# Patient Record
Sex: Male | Born: 1945 | Race: White | Hispanic: No | Marital: Single | Smoking: Never smoker
Health system: Southern US, Community
[De-identification: ages and names within clinical notes are randomized; demographics above are authoritative.]

## PROBLEM LIST (undated history)

## (undated) DIAGNOSIS — I10 Essential (primary) hypertension: Secondary | ICD-10-CM

## (undated) DIAGNOSIS — E785 Hyperlipidemia, unspecified: Secondary | ICD-10-CM

## (undated) DIAGNOSIS — A809 Acute poliomyelitis, unspecified: Secondary | ICD-10-CM

## (undated) DIAGNOSIS — N189 Chronic kidney disease, unspecified: Secondary | ICD-10-CM

## (undated) DIAGNOSIS — F99 Mental disorder, not otherwise specified: Secondary | ICD-10-CM

## (undated) DIAGNOSIS — N200 Calculus of kidney: Secondary | ICD-10-CM

## (undated) DIAGNOSIS — C4490 Unspecified malignant neoplasm of skin, unspecified: Secondary | ICD-10-CM

## (undated) DIAGNOSIS — M1990 Unspecified osteoarthritis, unspecified site: Secondary | ICD-10-CM

## (undated) DIAGNOSIS — Z923 Personal history of irradiation: Secondary | ICD-10-CM

## (undated) DIAGNOSIS — R569 Unspecified convulsions: Secondary | ICD-10-CM

## (undated) DIAGNOSIS — N99528 Other complication of other external stoma of urinary tract: Secondary | ICD-10-CM

## (undated) DIAGNOSIS — F32A Depression, unspecified: Secondary | ICD-10-CM

## (undated) DIAGNOSIS — K219 Gastro-esophageal reflux disease without esophagitis: Secondary | ICD-10-CM

## (undated) DIAGNOSIS — F329 Major depressive disorder, single episode, unspecified: Secondary | ICD-10-CM

## (undated) HISTORY — DX: Other complication of incontinent external stoma of urinary tract: N99.528

## (undated) HISTORY — PX: TOTAL HIP ARTHROPLASTY: SHX124

## (undated) HISTORY — DX: Calculus of kidney: N20.0

## (undated) HISTORY — DX: Major depressive disorder, single episode, unspecified: F32.9

## (undated) HISTORY — PX: CYSTOSCOPY/RETROGRADE/URETEROSCOPY/STONE EXTRACTION WITH BASKET: SHX5317

## (undated) HISTORY — DX: Depression, unspecified: F32.A

## (undated) HISTORY — DX: Chronic kidney disease, unspecified: N18.9

## (undated) HISTORY — DX: Personal history of irradiation: Z92.3

## (undated) HISTORY — DX: Unspecified malignant neoplasm of skin, unspecified: C44.90

---

## 1997-09-12 ENCOUNTER — Ambulatory Visit (HOSPITAL_COMMUNITY): Admission: RE | Admit: 1997-09-12 | Discharge: 1997-09-12 | Payer: Self-pay

## 1997-09-12 ENCOUNTER — Emergency Department (HOSPITAL_COMMUNITY): Admission: EM | Admit: 1997-09-12 | Discharge: 1997-09-12 | Payer: Self-pay

## 1997-09-13 ENCOUNTER — Emergency Department (HOSPITAL_COMMUNITY): Admission: EM | Admit: 1997-09-13 | Discharge: 1997-09-13 | Payer: Self-pay | Admitting: Emergency Medicine

## 1997-09-14 ENCOUNTER — Ambulatory Visit (HOSPITAL_COMMUNITY): Admission: RE | Admit: 1997-09-14 | Discharge: 1997-09-14 | Payer: Self-pay | Admitting: Internal Medicine

## 1997-10-23 ENCOUNTER — Emergency Department (HOSPITAL_COMMUNITY): Admission: EM | Admit: 1997-10-23 | Discharge: 1997-10-24 | Payer: Self-pay | Admitting: Emergency Medicine

## 1997-12-31 ENCOUNTER — Emergency Department (HOSPITAL_COMMUNITY): Admission: EM | Admit: 1997-12-31 | Discharge: 1997-12-31 | Payer: Self-pay | Admitting: Emergency Medicine

## 1998-10-09 ENCOUNTER — Emergency Department (HOSPITAL_COMMUNITY): Admission: EM | Admit: 1998-10-09 | Discharge: 1998-10-09 | Payer: Self-pay | Admitting: *Deleted

## 1999-06-01 ENCOUNTER — Encounter: Payer: Self-pay | Admitting: Emergency Medicine

## 1999-06-01 ENCOUNTER — Inpatient Hospital Stay (HOSPITAL_COMMUNITY): Admission: EM | Admit: 1999-06-01 | Discharge: 1999-06-06 | Payer: Self-pay | Admitting: Emergency Medicine

## 1999-06-01 ENCOUNTER — Encounter: Payer: Self-pay | Admitting: Orthopedic Surgery

## 1999-06-04 ENCOUNTER — Encounter: Payer: Self-pay | Admitting: Orthopedic Surgery

## 1999-06-06 ENCOUNTER — Inpatient Hospital Stay (HOSPITAL_COMMUNITY)
Admission: RE | Admit: 1999-06-06 | Discharge: 1999-06-13 | Payer: Self-pay | Admitting: Physical Medicine & Rehabilitation

## 2000-10-26 ENCOUNTER — Encounter: Admission: RE | Admit: 2000-10-26 | Discharge: 2000-10-26 | Payer: Self-pay | Admitting: Orthopedic Surgery

## 2000-10-26 ENCOUNTER — Encounter: Payer: Self-pay | Admitting: Orthopedic Surgery

## 2000-11-09 ENCOUNTER — Encounter: Admission: RE | Admit: 2000-11-09 | Discharge: 2000-11-09 | Payer: Self-pay | Admitting: Orthopedic Surgery

## 2000-11-09 ENCOUNTER — Encounter: Payer: Self-pay | Admitting: Orthopedic Surgery

## 2000-12-21 ENCOUNTER — Encounter: Admission: RE | Admit: 2000-12-21 | Discharge: 2001-02-15 | Payer: Self-pay | Admitting: Orthopedic Surgery

## 2001-04-22 ENCOUNTER — Emergency Department (HOSPITAL_COMMUNITY): Admission: EM | Admit: 2001-04-22 | Discharge: 2001-04-22 | Payer: Self-pay | Admitting: Emergency Medicine

## 2001-04-22 ENCOUNTER — Encounter: Payer: Self-pay | Admitting: Emergency Medicine

## 2001-09-07 ENCOUNTER — Emergency Department (HOSPITAL_COMMUNITY): Admission: EM | Admit: 2001-09-07 | Discharge: 2001-09-08 | Payer: Self-pay | Admitting: *Deleted

## 2001-09-07 ENCOUNTER — Encounter: Payer: Self-pay | Admitting: Emergency Medicine

## 2002-08-08 ENCOUNTER — Ambulatory Visit (HOSPITAL_COMMUNITY): Admission: RE | Admit: 2002-08-08 | Discharge: 2002-08-08 | Payer: Self-pay

## 2002-08-10 ENCOUNTER — Ambulatory Visit (HOSPITAL_COMMUNITY): Admission: RE | Admit: 2002-08-10 | Discharge: 2002-08-10 | Payer: Self-pay | Admitting: *Deleted

## 2002-10-27 ENCOUNTER — Encounter: Payer: Self-pay | Admitting: Urology

## 2002-10-27 ENCOUNTER — Encounter: Admission: RE | Admit: 2002-10-27 | Discharge: 2002-10-27 | Payer: Self-pay | Admitting: Urology

## 2002-10-31 ENCOUNTER — Ambulatory Visit (HOSPITAL_BASED_OUTPATIENT_CLINIC_OR_DEPARTMENT_OTHER): Admission: RE | Admit: 2002-10-31 | Discharge: 2002-10-31 | Payer: Self-pay | Admitting: Urology

## 2003-02-20 ENCOUNTER — Ambulatory Visit (HOSPITAL_BASED_OUTPATIENT_CLINIC_OR_DEPARTMENT_OTHER): Admission: RE | Admit: 2003-02-20 | Discharge: 2003-02-20 | Payer: Self-pay | Admitting: Urology

## 2003-02-20 ENCOUNTER — Ambulatory Visit (HOSPITAL_COMMUNITY): Admission: RE | Admit: 2003-02-20 | Discharge: 2003-02-20 | Payer: Self-pay | Admitting: Urology

## 2003-02-22 ENCOUNTER — Ambulatory Visit (HOSPITAL_COMMUNITY): Admission: RE | Admit: 2003-02-22 | Discharge: 2003-02-22 | Payer: Self-pay | Admitting: Urology

## 2003-05-15 ENCOUNTER — Ambulatory Visit (HOSPITAL_COMMUNITY): Admission: RE | Admit: 2003-05-15 | Discharge: 2003-05-15 | Payer: Self-pay | Admitting: Internal Medicine

## 2004-11-03 ENCOUNTER — Encounter: Admission: RE | Admit: 2004-11-03 | Discharge: 2005-02-01 | Payer: Self-pay | Admitting: Internal Medicine

## 2006-05-22 ENCOUNTER — Emergency Department (HOSPITAL_COMMUNITY): Admission: EM | Admit: 2006-05-22 | Discharge: 2006-05-22 | Payer: Self-pay | Admitting: Family Medicine

## 2008-10-30 ENCOUNTER — Encounter: Admission: RE | Admit: 2008-10-30 | Discharge: 2008-10-30 | Payer: Self-pay | Admitting: Internal Medicine

## 2008-11-21 ENCOUNTER — Encounter: Admission: RE | Admit: 2008-11-21 | Discharge: 2008-11-21 | Payer: Self-pay | Admitting: Internal Medicine

## 2010-08-22 NOTE — Op Note (Signed)
NAME:  [name] [name], CULTON                         ACCOUNT NO.:  [id number]   MEDICAL RECORD NO.:  [id number]                   PATIENT TYPE:  AMB   LOCATION:  [facility]                                 FACILITY:  [facility]   PHYSICIAN:  Boston Service, M.D.             DATE OF BIRTH:  [birth year]   DATE OF PROCEDURE:  10/31/2002  DATE OF DISCHARGE:                                 OPERATIVE REPORT   PREOPERATIVE DIAGNOSIS:  12 mm right ureteral calculous with multiple right  renal calculi.   POSTOPERATIVE DIAGNOSIS:  12 mm right ureteral calculous with multiple right  renal calculi.   PROCEDURE:  Cystoscopy, retrograde, holmium laser fragmentation of 12 mm  right ureteral calculous, right double J stent placement, 20 French Foley  for drain.   ANESTHESIA:  General.   INDICATIONS FOR PROCEDURE:  A 65 year old male with underlying history of  mental retardation and a seizure disorder has recently relocated from  [place], [place] to [place]. Local internal  medicine followup has been arranged with Dr. Karilyn Cota. Patient seen at our  office with distant history of kidney stones. CT Aug 11, 2002, 12 mm right  ureteral calculous, 9.1 cm right kidney with multiple calculi, 12.5 cm left  kidney with simple cysts. Difficult to know due to patient's underlying  medical condition how long the stone has been present. Preoperative  evaluation with Dr. Karilyn Cota was completed. The patient now presents for  procedure.   DESCRIPTION OF PROCEDURE:  The patient was prepped and draped in the dorsal  lithotomy position after institution of an adequate level of general  anesthesia. A well lubricated 21 French panendoscope was gently inserted at  the urethral meatus, normal urethra and sphincter. Mildly coapting lateral  lobes of the prostate. Bladder shows mild trabeculation but no evidence of  tumor, stone, bleeding site or other anatomic abnormality. Left retrograde  showed normal  course and caliber of the ureter, pelvis and calices. Right  retrograde showed dilation of the distal right ureter with a 12 mm  calcification above the vessels and proximal hydronephrosis. The floppy-tip  guidewire  was negotiated beyond the stone, coiled nicely in the upper pole  calices. Ureteroscope was then inserted along side the guidewire. The  guidewire appeared to pass over the stone at about the 12 o'clock position.  A second guidewire was selected and passed under the stone at about the 6  o'clock position, it too coiled nicely in the upper pole calices.  Ureteroscope was reinserted with the 365 holmium fiber at appropriate  settings. Fragmentation commenced and over a period of about 15-25 minutes,  the stone was completely fragmented. A small remaining calculi within the  mid ureter measured no more than about 1 or 2 mm. The ureteroscope and  holmium fiber were then withdrawn. The cystoscope was reintroduced and a 6  French 26 cm double J stent was advanced over the indwelling guidewire.  There was excellent pigtail formation on guidewire removal. The bladder was  left filled with several hundred mL of irrigant. The panendoscope was  withdrawn, the 20 [place] Foley was inserted and left to straight drain and  patient was returned to recovery in satisfactory condition.                                               Boston Service, M.D.    RH/MEDQ  D:  10/31/2002  T:  10/31/2002  Job:  [id number]   cc:   Wilson Singer, M.D.  [address]  [place]  [place] [address]  Fax: [phone]

## 2010-08-22 NOTE — Op Note (Signed)
NAME:  [name] [name], [name] [name]                         ACCOUNT NO.:  [id number]   MEDICAL RECORD NO.:  [id number]                   PATIENT TYPE:  AMB   LOCATION:  [facility]                                 FACILITY:  [facility] [place] [facility]   PHYSICIAN:  Boston Service, M.D.             DATE OF BIRTH:  [birth year]   DATE OF PROCEDURE:  02/20/2003  DATE OF DISCHARGE:                                 OPERATIVE REPORT   PREOPERATIVE DIAGNOSES:  Status post ureteroscopy and holmium laser  fragmentation of longstanding large calculus within the right mid ureter.  Treatment was completed in July of 2004. The patient has always been  pleasant and cooperative but has been only intermittently compliant with  followup. Presented to the office in November of 2004. KUB demonstrated  small stony fragments in the right distal ureter with scattered fragments in  the right renal pelvis.   POSTOPERATIVE DIAGNOSES:  Not given.   PROCEDURE:  A decision made to proceed with double J stent removal,  evacuation of stones from the distal ureter, placement of a fresh double J  stent and scheduling of ESWL.   ANESTHESIA:  General.   DRAINS:  6 French 26 cm double J stent.   SPECIMENS:  Distal right ureteral calculi.   DESCRIPTION OF PROCEDURE:  The patient was prepped and draped in the dorsal  lithotomy position after institution of an adequate level of general  anesthesia. A well lubricated 21 French panendoscope was gently inserted at  the urethral meatus, normal urethra and sphincter, nonobstructive prostate,  left retrograde showed normal course and caliber of the ureter, pelvis and  calices. Right double J stent was then easily withdrawn. Floppy tip  guidewire was then passed at the right ureteral orifice, seemed to coil  nicely in the upper pole calices. The ureteroscope was inserted along side  the guidewire, multitude of 4-5 mm fragments were identified within the  right distal ureter, negotiated into the flat wire basket  and then  withdrawn. Once all visible fragments had been removed from the distal  ureter, the ureteroscope was inserted to the limit of the short 6 [place]  scope just below the right UPJ, no additional  ureteral calculi were identified. The ureteroscope was withdrawn. A fresh 6  French 26 cm double J stent was then passed over the guidewire with  excellent pigtail formation on guidewire removal. The bladder was drained,  cystoscope was removed, patient was given a B&O suppository and returned to  recovery in satisfactory condition.                                               [place] Service, M.D.    RH/MEDQ  D:  02/20/2003  T:  02/20/2003  Job:  [id number]   cc:   Dr. Gwendel Hanson, [place]  Wilson Singer, M.D.  [address]  [place]  [place] [address]  Fax: [phone]

## 2010-11-08 ENCOUNTER — Inpatient Hospital Stay (INDEPENDENT_AMBULATORY_CARE_PROVIDER_SITE_OTHER)
Admission: RE | Admit: 2010-11-08 | Discharge: 2010-11-08 | Disposition: A | Discharge status: Home / Self Care | Payer: Medicaid Other | Source: Ambulatory Visit | Attending: Family Medicine | Admitting: Family Medicine

## 2010-11-08 DIAGNOSIS — L0201 Cutaneous abscess of face: Secondary | ICD-10-CM

## 2010-11-10 ENCOUNTER — Inpatient Hospital Stay (INDEPENDENT_AMBULATORY_CARE_PROVIDER_SITE_OTHER)
Admission: RE | Admit: 2010-11-10 | Discharge: 2010-11-10 | Disposition: A | Discharge status: Home / Self Care | Payer: Medicaid Other | Source: Ambulatory Visit | Attending: Emergency Medicine | Admitting: Emergency Medicine

## 2010-11-11 LAB — CULTURE, ROUTINE-ABSCESS

## 2011-03-13 ENCOUNTER — Other Ambulatory Visit: Payer: Self-pay | Admitting: Internal Medicine

## 2011-03-13 ENCOUNTER — Ambulatory Visit
Admission: RE | Admit: 2011-03-13 | Discharge: 2011-03-13 | Disposition: A | Discharge status: Home / Self Care | Payer: Medicaid Other | Source: Ambulatory Visit | Attending: Internal Medicine | Admitting: Internal Medicine

## 2011-03-13 DIAGNOSIS — R05 Cough: Secondary | ICD-10-CM

## 2011-03-27 ENCOUNTER — Other Ambulatory Visit: Payer: Self-pay | Admitting: Specialist

## 2011-04-02 ENCOUNTER — Encounter (HOSPITAL_BASED_OUTPATIENT_CLINIC_OR_DEPARTMENT_OTHER): Payer: Self-pay | Admitting: *Deleted

## 2011-04-02 NOTE — Progress Notes (Signed)
Pt cannot communicate-lives with friend-he care for himwill bring for labs and dos 

## 2011-04-03 ENCOUNTER — Encounter (HOSPITAL_BASED_OUTPATIENT_CLINIC_OR_DEPARTMENT_OTHER)
Admission: RE | Admit: 2011-04-03 | Discharge: 2011-04-03 | Disposition: A | Discharge status: Home / Self Care | Payer: Medicaid Other | Source: Ambulatory Visit | Attending: Specialist | Admitting: Specialist

## 2011-04-03 LAB — DIFFERENTIAL
Basophils Absolute: 0 10*3/uL (ref 0.0–0.1)
Basophils Relative: 1 % (ref 0–1)
Eosinophils Absolute: 0.2 10*3/uL (ref 0.0–0.7)
Eosinophils Relative: 3 % (ref 0–5)
Lymphs Abs: 1.1 10*3/uL (ref 0.7–4.0)
Neutrophils Relative %: 70 % (ref 43–77)

## 2011-04-03 LAB — BASIC METABOLIC PANEL
Calcium: 9.7 mg/dL (ref 8.4–10.5)
Creatinine, Ser: 1.35 mg/dL (ref 0.50–1.35)
GFR calc non Af Amer: 54 mL/min — ABNORMAL LOW (ref >90)
Glucose, Bld: 124 mg/dL — ABNORMAL HIGH (ref 70–99)
Sodium: 141 mEq/L (ref 135–145)

## 2011-04-03 LAB — CBC
MCH: 28.7 pg (ref 26.0–34.0)
MCHC: 32.7 g/dL (ref 30.0–36.0)
Platelets: 212 10*3/uL (ref 150–400)
RBC: 5.01 MIL/uL (ref 4.22–5.81)
RDW: 14.2 % (ref 11.5–15.5)

## 2011-04-06 ENCOUNTER — Ambulatory Visit (HOSPITAL_BASED_OUTPATIENT_CLINIC_OR_DEPARTMENT_OTHER): Payer: Medicaid Other | Admitting: Anesthesiology

## 2011-04-06 ENCOUNTER — Encounter (HOSPITAL_BASED_OUTPATIENT_CLINIC_OR_DEPARTMENT_OTHER)
Admission: RE | Disposition: A | Discharge status: Home / Self Care | Payer: Self-pay | Source: Ambulatory Visit | Attending: Specialist

## 2011-04-06 ENCOUNTER — Encounter (HOSPITAL_BASED_OUTPATIENT_CLINIC_OR_DEPARTMENT_OTHER): Payer: Self-pay | Admitting: *Deleted

## 2011-04-06 ENCOUNTER — Encounter (HOSPITAL_BASED_OUTPATIENT_CLINIC_OR_DEPARTMENT_OTHER): Payer: Self-pay | Admitting: Anesthesiology

## 2011-04-06 ENCOUNTER — Other Ambulatory Visit: Payer: Self-pay | Admitting: Specialist

## 2011-04-06 ENCOUNTER — Ambulatory Visit (HOSPITAL_BASED_OUTPATIENT_CLINIC_OR_DEPARTMENT_OTHER)
Admission: RE | Admit: 2011-04-06 | Discharge: 2011-04-06 | Disposition: A | Discharge status: Home / Self Care | Payer: Medicaid Other | Source: Ambulatory Visit | Attending: Specialist | Admitting: Specialist

## 2011-04-06 DIAGNOSIS — K219 Gastro-esophageal reflux disease without esophagitis: Secondary | ICD-10-CM | POA: Insufficient documentation

## 2011-04-06 DIAGNOSIS — Z01812 Encounter for preprocedural laboratory examination: Secondary | ICD-10-CM | POA: Insufficient documentation

## 2011-04-06 DIAGNOSIS — I10 Essential (primary) hypertension: Secondary | ICD-10-CM | POA: Insufficient documentation

## 2011-04-06 DIAGNOSIS — E119 Type 2 diabetes mellitus without complications: Secondary | ICD-10-CM | POA: Insufficient documentation

## 2011-04-06 DIAGNOSIS — C4490 Unspecified malignant neoplasm of skin, unspecified: Secondary | ICD-10-CM

## 2011-04-06 DIAGNOSIS — J449 Chronic obstructive pulmonary disease, unspecified: Secondary | ICD-10-CM | POA: Insufficient documentation

## 2011-04-06 DIAGNOSIS — J4489 Other specified chronic obstructive pulmonary disease: Secondary | ICD-10-CM | POA: Insufficient documentation

## 2011-04-06 DIAGNOSIS — C44320 Squamous cell carcinoma of skin of unspecified parts of face: Secondary | ICD-10-CM | POA: Insufficient documentation

## 2011-04-06 DIAGNOSIS — R569 Unspecified convulsions: Secondary | ICD-10-CM | POA: Insufficient documentation

## 2011-04-06 HISTORY — DX: Acute poliomyelitis, unspecified: A80.9

## 2011-04-06 HISTORY — DX: Hyperlipidemia, unspecified: E78.5

## 2011-04-06 HISTORY — DX: Gastro-esophageal reflux disease without esophagitis: K21.9

## 2011-04-06 HISTORY — PX: MASS EXCISION: SHX2000

## 2011-04-06 HISTORY — DX: Mental disorder, not otherwise specified: F99

## 2011-04-06 HISTORY — DX: Unspecified osteoarthritis, unspecified site: M19.90

## 2011-04-06 HISTORY — DX: Unspecified malignant neoplasm of skin, unspecified: C44.90

## 2011-04-06 HISTORY — DX: Essential (primary) hypertension: I10

## 2011-04-06 HISTORY — DX: Unspecified convulsions: R56.9

## 2011-04-06 LAB — GLUCOSE, CAPILLARY: Glucose-Capillary: 111 mg/dL — ABNORMAL HIGH (ref 70–99)

## 2011-04-06 LAB — POCT HEMOGLOBIN-HEMACUE: Hemoglobin: 14 g/dL (ref 13.0–17.0)

## 2011-04-06 SURGERY — EXCISION MASS
Anesthesia: General | Site: Face | Wound class: Clean

## 2011-04-06 MED ORDER — PHENYLEPHRINE HCL 10 MG/ML IJ SOLN
INTRAMUSCULAR | Status: DC | PRN
  Administered 2011-04-06: 40 ug via INTRAVENOUS

## 2011-04-06 MED ORDER — PROMETHAZINE HCL 25 MG/ML IJ SOLN: 6.2500 mg | INTRAMUSCULAR | Status: DC | PRN

## 2011-04-06 MED ORDER — MIDAZOLAM HCL 2 MG/2ML IJ SOLN: 1.0000 mg | INTRAMUSCULAR | Status: DC | PRN

## 2011-04-06 MED ORDER — PROPOFOL 10 MG/ML IV EMUL
INTRAVENOUS | Status: DC | PRN
  Administered 2011-04-06: 200 mg via INTRAVENOUS

## 2011-04-06 MED ORDER — FENTANYL CITRATE 0.05 MG/ML IJ SOLN: 50.0000 ug | INTRAMUSCULAR | Status: DC | PRN

## 2011-04-06 MED ORDER — CEFAZOLIN SODIUM-DEXTROSE 2-3 GM-% IV SOLR
2.0000 g | INTRAVENOUS | Status: AC
  Administered 2011-04-06: 2 g via INTRAVENOUS

## 2011-04-06 MED ORDER — LACTATED RINGERS IV SOLN
INTRAVENOUS | Status: DC
  Administered 2011-04-06: 07:00:00 via INTRAVENOUS

## 2011-04-06 MED ORDER — FENTANYL CITRATE 0.05 MG/ML IJ SOLN
INTRAMUSCULAR | Status: DC | PRN
  Administered 2011-04-06: 25 ug via INTRAVENOUS
  Administered 2011-04-06: 50 ug via INTRAVENOUS
  Administered 2011-04-06: 25 ug via INTRAVENOUS

## 2011-04-06 MED ORDER — EPHEDRINE SULFATE 50 MG/ML IJ SOLN
INTRAMUSCULAR | Status: DC | PRN
  Administered 2011-04-06: 10 mg via INTRAVENOUS
  Administered 2011-04-06: 15 mg via INTRAVENOUS

## 2011-04-06 MED ORDER — ONDANSETRON HCL 4 MG/2ML IJ SOLN
INTRAMUSCULAR | Status: DC | PRN
  Administered 2011-04-06: 4 mg via INTRAVENOUS

## 2011-04-06 MED ORDER — CEFAZOLIN SODIUM 1-5 GM-% IV SOLN: 1.0000 g | INTRAVENOUS | Status: DC

## 2011-04-06 MED ORDER — LIDOCAINE-EPINEPHRINE 0.5-1:200000 % IJ SOLN
INTRAMUSCULAR | Status: DC | PRN
  Administered 2011-04-06: 100 mL

## 2011-04-06 MED ORDER — SODIUM CHLORIDE 0.9 % IV SOLN
INTRAVENOUS | Status: DC | PRN
  Administered 2011-04-06: 50 mL via INTRAMUSCULAR

## 2011-04-06 MED ORDER — PHENYLEPHRINE HCL 10 MG/ML IJ SOLN
10.0000 mg | INTRAVENOUS | Status: DC | PRN
  Administered 2011-04-06: 50 ug/min via INTRAVENOUS

## 2011-04-06 MED ORDER — LORAZEPAM 2 MG/ML IJ SOLN: 1.0000 mg | Freq: Once | INTRAMUSCULAR | Status: DC | PRN

## 2011-04-06 MED ORDER — HYDROMORPHONE HCL PF 1 MG/ML IJ SOLN: 0.2500 mg | INTRAMUSCULAR | Status: DC | PRN

## 2011-04-06 MED ORDER — LIDOCAINE HCL (CARDIAC) 20 MG/ML IV SOLN
INTRAVENOUS | Status: DC | PRN
  Administered 2011-04-06: 60 mg via INTRAVENOUS

## 2011-04-06 SURGICAL SUPPLY — 49 items
BANDAGE ACE 4 STERILE (GAUZE/BANDAGES/DRESSINGS) ×2 IMPLANT
BANDAGE GAUZE ELAST BULKY 4 IN (GAUZE/BANDAGES/DRESSINGS) IMPLANT
BENZOIN TINCTURE PRP APPL 2/3 (GAUZE/BANDAGES/DRESSINGS) ×2 IMPLANT
BLADE KNIFE PERSONA 10 (BLADE) ×2 IMPLANT
BLADE KNIFE PERSONA 15 (BLADE) ×4 IMPLANT
CANISTER SUCTION 1200CC (MISCELLANEOUS) ×2 IMPLANT
CLEANER CAUTERY TIP 5X5 PAD (MISCELLANEOUS) ×1 IMPLANT
CLOSURE STERI STRIP 1/2 X4 (GAUZE/BANDAGES/DRESSINGS) ×2 IMPLANT
CLOTH BEACON ORANGE TIMEOUT ST (SAFETY) ×2 IMPLANT
COTTONBALL LRG STERILE PKG (GAUZE/BANDAGES/DRESSINGS) IMPLANT
COVER MAYO STAND STRL (DRAPES) ×2 IMPLANT
COVER TABLE BACK 60X90 (DRAPES) ×2 IMPLANT
DRAPE PED LAPAROTOMY (DRAPES) IMPLANT
DRAPE U-SHAPE 76X120 STRL (DRAPES) IMPLANT
DRESSING TELFA 8X3 (GAUZE/BANDAGES/DRESSINGS) ×2 IMPLANT
DRSG PAD ABDOMINAL 8X10 ST (GAUZE/BANDAGES/DRESSINGS) ×2 IMPLANT
ELECT NEEDLE TIP 2.8 STRL (NEEDLE) ×2 IMPLANT
ELECT REM PT RETURN 9FT ADLT (ELECTROSURGICAL) ×2
ELECTRODE REM PT RTRN 9FT ADLT (ELECTROSURGICAL) ×1 IMPLANT
GAUZE SPONGE 4X4 12PLY STRL LF (GAUZE/BANDAGES/DRESSINGS) ×2 IMPLANT
GAUZE SPONGE 4X4 16PLY XRAY LF (GAUZE/BANDAGES/DRESSINGS) IMPLANT
GAUZE XEROFORM 5X9 LF (GAUZE/BANDAGES/DRESSINGS) ×2 IMPLANT
GLOVE BIOGEL M STRL SZ7.5 (GLOVE) ×6 IMPLANT
GLOVE ECLIPSE 7.0 STRL STRAW (GLOVE) ×2 IMPLANT
GOWN PREVENTION PLUS XLARGE (GOWN DISPOSABLE) IMPLANT
GOWN PREVENTION PLUS XXLARGE (GOWN DISPOSABLE) ×4 IMPLANT
NEEDLE HYPO 25X1 1.5 SAFETY (NEEDLE) IMPLANT
NEEDLE SPNL 18GX3.5 QUINCKE PK (NEEDLE) ×4 IMPLANT
PACK BASIN DAY SURGERY FS (CUSTOM PROCEDURE TRAY) ×2 IMPLANT
PAD CLEANER CAUTERY TIP 5X5 (MISCELLANEOUS) ×1
PADDING WEBRIL 4 STERILE (GAUZE/BANDAGES/DRESSINGS) ×2 IMPLANT
PENCIL BUTTON HOLSTER BLD 10FT (ELECTRODE) ×2 IMPLANT
SHEET MEDIUM DRAPE 40X70 STRL (DRAPES) ×2 IMPLANT
SPONGE GAUZE 4X4 12PLY (GAUZE/BANDAGES/DRESSINGS) IMPLANT
STAPLER VISISTAT 35W (STAPLE) IMPLANT
STRIP SUTURE WOUND CLOSURE 1/2 (SUTURE) ×4 IMPLANT
SUCTION FRAZIER TIP 10 FR DISP (SUCTIONS) ×2 IMPLANT
SUT MNCRL AB 3-0 PS2 18 (SUTURE) ×2 IMPLANT
SUT MON AB 2-0 CT1 36 (SUTURE) ×2 IMPLANT
SUT PROLENE 4 0 P 3 18 (SUTURE) IMPLANT
SUT PROLENE 4 0 PS 2 18 (SUTURE) ×4 IMPLANT
SUT SILK 3 0 PS 1 (SUTURE) IMPLANT
SUT VIC AB 3-0 FS2 27 (SUTURE) IMPLANT
SYR CONTROL 10ML LL (SYRINGE) ×4 IMPLANT
TAPE HYPAFIX 6X30 (GAUZE/BANDAGES/DRESSINGS) ×2 IMPLANT
TAPE PAPER MEDFIX 1IN X 10YD (GAUZE/BANDAGES/DRESSINGS) ×2 IMPLANT
TOWEL OR 17X24 6PK STRL BLUE (TOWEL DISPOSABLE) ×6 IMPLANT
TUBE CONNECTING 20X1/4 (TUBING) ×2 IMPLANT
WATER STERILE IRR 1000ML POUR (IV SOLUTION) IMPLANT

## 2011-04-06 NOTE — Anesthesia Procedure Notes (Signed)
Procedure Name: LMA Insertion Performed by: Romona Murdy Pre-anesthesia Checklist: Patient identified, Emergency Drugs available, Suction available, Patient being monitored and Timeout performed Patient Re-evaluated:Patient Re-evaluated prior to inductionOxygen Delivery Method: Circle System Utilized Preoxygenation: Pre-oxygenation with 100% oxygen Intubation Type: IV induction Ventilation: Mask ventilation without difficulty LMA: LMA with gastric port inserted LMA Size: 4.0 Number of attempts: 1 Placement Confirmation: breath sounds checked- equal and bilateral and positive ETCO2 Tube secured with: Tape Dental Injury: Teeth and Oropharynx as per pre-operative assessment      

## 2011-04-06 NOTE — H&P (View-Only) (Signed)
Pt cannot communicate-lives with friend-he care for himwill bring for labs and dos

## 2011-04-06 NOTE — Anesthesia Postprocedure Evaluation (Signed)
  Anesthesia Post-op Note  Patient: [name] [name]  Procedure(s) Performed:  EXCISION MASS - excision of squamous cell carcinoma right face with full thickness skin graft from left groin  Patient Location: PACU  Anesthesia Type: General  Level of Consciousness: awake and alert   Airway and Oxygen Therapy: Patient Spontanous Breathing  Post-op Pain: mild  Post-op Assessment: Post-op Vital signs reviewed, Patient's Cardiovascular Status Stable, Respiratory Function Stable, Patent Airway, No signs of Nausea or vomiting, Adequate PO intake and Pain level controlled  Post-op Vital Signs: stable  Complications: No apparent anesthesia complications

## 2011-04-06 NOTE — Anesthesia Preprocedure Evaluation (Addendum)
Anesthesia Evaluation  Patient identified by MRN, date of birth, ID band Patient awake    Reviewed: Allergy & Precautions, H&P , NPO status , Patient's Chart, lab work & pertinent test results  Airway Mallampati: II TM Distance: >3 FB Neck ROM: Full    Dental   Pulmonary COPD   Pulmonary exam normal       Cardiovascular hypertension,     Neuro/Psych Seizures -,  PSYCHIATRIC DISORDERS    GI/Hepatic GERD-  ,  Endo/Other  Diabetes mellitus-  Renal/GU      Musculoskeletal   Abdominal (+) obese,   Peds  Hematology   Anesthesia Other Findings cough  Reproductive/Obstetrics                         Anesthesia Physical Anesthesia Plan  ASA: III  Anesthesia Plan: General   Post-op Pain Management:    Induction: Intravenous  Airway Management Planned: LMA  Additional Equipment:   Intra-op Plan:   Post-operative Plan: Extubation in OR  Informed Consent: I have reviewed the patients History and Physical, chart, labs and discussed the procedure including the risks, benefits and alternatives for the proposed anesthesia with the patient or authorized representative who has indicated his/her understanding and acceptance.     Plan Discussed with: CRNA and Surgeon  Anesthesia Plan Comments:         Anesthesia Quick Evaluation

## 2011-04-06 NOTE — Transfer of Care (Signed)
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

## 2011-04-06 NOTE — Interval H&P Note (Signed)
History and Physical Interval Note:  04/06/2011 7:43 AM  [name] [name]  has presented today for surgery, with the diagnosis of squamous cell carcinoma  The various methods of treatment have been discussed with the patient and family. After consideration of risks, benefits and other options for treatment, the patient has consented to  Procedure(s): EXCISION MASS as a surgical intervention .  The patients' history has been reviewed, patient examined, no change in status, stable for surgery.  I have reviewed the patients' chart and labs.  Questions were answered to the patient's satisfaction.     Latanya Hemmer L

## 2011-04-06 NOTE — Brief Op Note (Signed)
04/06/2011  9:54 AM  PATIENT:  [name] [name]  65 y.o. male  PRE-OPERATIVE DIAGNOSIS:  squamous cell carcinoma  POST-OPERATIVE DIAGNOSIS:  squamous cell carcinoma  PROCEDURE:  Procedure(s): EXCISION MASS  SURGEON:  Surgeon(s): Yaakov Guthrie Arleny Kruger  PHYSICIAN ASSISTANT:   ASSISTANTS: none   ANESTHESIA:   none  EBL:  Total I/O In: 2100 [I.V.:2100] Out: -   BLOOD ADMINISTERED:none  DRAINS: none   LOCAL MEDICATIONS USED:  LIDOCAINE 75CC  SPECIMEN:  Excision  DISPOSITION OF SPECIMEN:  PATHOLOGY  COUNTS:  NO   TOURNIQUET:  * No tourniquets in log *  DICTATION: .Other Dictation: Dictation Number [id number]  PLAN OF CARE: Discharge to home after PACU  PATIENT DISPOSITION:

## 2011-04-07 NOTE — Op Note (Signed)
NAME:  [name] [name]                    ACCOUNT NO.:  MEDICAL RECORD NO.:  [id number]  LOCATION:                                 FACILITY:  PHYSICIAN:  Fayetta Sorenson L. [name] [name] WebbDATE OF BIRTH:  11/18/1945  DATE OF PROCEDURE:  04/06/2011 DATE OF DISCHARGE:                              OPERATIVE REPORT   A 66 year old gentleman who had a lesion involving his right temple area for several years, had been seen by several physicians before, and had biopsies done showing sebaceous cyst.  When I saw the patient, clinically, it did appear to be a sebaceous cyst and I drained it to relieve all the pressure and infection that he had when I first saw him. I did review all of his pathology reports showing the biopsy showing a sebaceous cyst.  Over the ensuing weeks, the area never healed properly and never dried like I anticipated to.  Thus, I felt that this may be another type of lesion of unknown behavior.  Thus, I brought the patient back and did a deeper full-thickness biopsy, which actually shows squamous cell carcinoma.  I debrided the area out, and we allowed this area to now healing some in preparation for the wide excision margins with full-thickness skin graft coverage of the defect.  ANESTHESIA:  General.  Preoperatively, the patient had drawings on the margins of the tumor, which was an ulcerating type of tumor involving the right temporal area, measuring over 6.7 cm x 6.7 cm.  He underwent general anesthesia and intubated orally.  Prep was done to his face, neck areas and separately to the left groin area for the full-thickness skin graft salvage. Sterile Doppler towels and drapes were placed so as to make a sterile field.  Markings were used to outline wide margins of this tumor.  These were then scored after local infiltration of 1/3% Xylocaine with epinephrine was done, a total of 70 mL.  The incision was then carried down to skin with 15 blade down to underlying deep  subcutaneous tissue. Using careful dissection, I was able to dissect down to the muscle fascia and access to the fascia off the temporalis musculature in order for me to get under the tumor, which had invaded deeply in its mid portion.  I was able to grossly go underneath the tumor, as we dissected off with some delicacy and also protecting the neurovascular structures. I was able to take some branches off the superficial temporalis artery and some venous structures as well, as we dissected smoothly across the areas.  Hemostasis was maintained with the Bovie anticoagulation.  After removing the fungating tumor, we were able to irrigate the wound and then pack it lightly with sterile saline lap pad.  Next, a pattern was made of the defect of the right temporal area and transposed over to the left groin region where a full-thickness skin graft taken with a large paddle over the lower abdomen and inguinal regions, using sterile instruments separate, we were able to dissect out the full-thickness defect and then close plastic fashion first by freeing up the anterior portion and cephalic portion of the dissection approximately 6 cm up and  approximately 6 cm down.  This allowed [place] to bring the flap together in advancement pattern and secure it with 2-0 Monocryl sutures deeply, x2 layers and then a subdermal suture of 3-0 Monocryl, then a running subcuticular stitch of 3-0 Monocryl.  Steri-Strips and soft dressing were applied to all these areas.  Next, attention was drawn to the full- thickness skin graft and was defatted, placed over the defect, and secured with running 4-0 Prolene sutures.  We also left some sutures along for stent dressings.  Xeroform, Telfa, cotton, wet and dry were placed over the defect and then secured with stent dressings, then the 4- 0 Prolene was tied down securely and then 4x4s, ABDs, Kerlix and Ace wraps were applied.  The donor site was cut with Steri-Strips,  4x4s, ABDs, and Hypafix tape.  He tolerated all the procedures very well.  ESTIMATED BLOOD LOSS:  Approximately 200 mL.  COMPLICATIONS:  None.  The patient to be discharged home today.  He has my phone number and my cell numbers to call me for any medical problems.  He already has his medications to take at home including pain medicine and antibiotics.     [name] [name], M.D.     [name] [name]  D:  04/06/2011  T:  04/07/2011  Job:  [id number]

## 2011-04-08 ENCOUNTER — Encounter (HOSPITAL_BASED_OUTPATIENT_CLINIC_OR_DEPARTMENT_OTHER): Payer: Self-pay | Admitting: Specialist

## 2011-07-16 ENCOUNTER — Encounter: Payer: Self-pay | Admitting: Radiation Oncology

## 2011-07-21 ENCOUNTER — Other Ambulatory Visit: Payer: Self-pay | Admitting: Radiation Oncology

## 2011-07-21 ENCOUNTER — Ambulatory Visit
Admission: RE | Admit: 2011-07-21 | Discharge: 2011-07-21 | Disposition: A | Discharge status: Home / Self Care | Payer: Medicaid Other | Source: Ambulatory Visit | Attending: Radiation Oncology | Admitting: Radiation Oncology

## 2011-07-21 ENCOUNTER — Encounter: Payer: Self-pay | Admitting: Radiation Oncology

## 2011-07-21 ENCOUNTER — Encounter: Payer: Self-pay | Admitting: *Deleted

## 2011-07-21 VITALS — BP 115/69 | HR 76 | Temp 97.3°F | Resp 18 | Ht 73.0 in | Wt 228.5 lb

## 2011-07-21 DIAGNOSIS — M810 Age-related osteoporosis without current pathological fracture: Secondary | ICD-10-CM | POA: Insufficient documentation

## 2011-07-21 DIAGNOSIS — C4490 Unspecified malignant neoplasm of skin, unspecified: Secondary | ICD-10-CM | POA: Insufficient documentation

## 2011-07-21 DIAGNOSIS — K219 Gastro-esophageal reflux disease without esophagitis: Secondary | ICD-10-CM | POA: Insufficient documentation

## 2011-07-21 DIAGNOSIS — D239 Other benign neoplasm of skin, unspecified: Secondary | ICD-10-CM | POA: Insufficient documentation

## 2011-07-21 DIAGNOSIS — N189 Chronic kidney disease, unspecified: Secondary | ICD-10-CM | POA: Insufficient documentation

## 2011-07-21 DIAGNOSIS — N200 Calculus of kidney: Secondary | ICD-10-CM | POA: Insufficient documentation

## 2011-07-21 DIAGNOSIS — F99 Mental disorder, not otherwise specified: Secondary | ICD-10-CM | POA: Insufficient documentation

## 2011-07-21 DIAGNOSIS — L988 Other specified disorders of the skin and subcutaneous tissue: Secondary | ICD-10-CM | POA: Insufficient documentation

## 2011-07-21 DIAGNOSIS — E119 Type 2 diabetes mellitus without complications: Secondary | ICD-10-CM | POA: Insufficient documentation

## 2011-07-21 DIAGNOSIS — C44320 Squamous cell carcinoma of skin of unspecified parts of face: Secondary | ICD-10-CM | POA: Insufficient documentation

## 2011-07-21 DIAGNOSIS — A809 Acute poliomyelitis, unspecified: Secondary | ICD-10-CM | POA: Insufficient documentation

## 2011-07-21 DIAGNOSIS — R569 Unspecified convulsions: Secondary | ICD-10-CM | POA: Insufficient documentation

## 2011-07-21 DIAGNOSIS — I129 Hypertensive chronic kidney disease with stage 1 through stage 4 chronic kidney disease, or unspecified chronic kidney disease: Secondary | ICD-10-CM | POA: Insufficient documentation

## 2011-07-21 DIAGNOSIS — E785 Hyperlipidemia, unspecified: Secondary | ICD-10-CM | POA: Insufficient documentation

## 2011-07-21 DIAGNOSIS — Z510 Encounter for antineoplastic radiation therapy: Secondary | ICD-10-CM | POA: Insufficient documentation

## 2011-07-21 DIAGNOSIS — Z8612 Personal history of poliomyelitis: Secondary | ICD-10-CM | POA: Insufficient documentation

## 2011-07-21 NOTE — Progress Notes (Signed)
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 male who is seen today through the courtesy of Dr. [name] for consideration of postoperative radiation therapy in the management of his squamous cell carcinoma the skin arising from the right temple. He apparently had a growing lesion along his right temple for several years. Previous biopsies showed a sebaceous cyst. More recently, this was excised but never really healed. Dr. [name] suspect the possibility of carcinoma a deeper biopsy was diagnostic for squamous cell carcinoma. This was widely excised and covered with a full thickness skin graft on 04/06/2011. The graft has completely healed. The tumor was deeply invasive in the midportion. On review of his pathology he was found to have invasive squamous cell carcinoma spanning 5.6 cm with invasive carcinoma broadly present at the deep resection margin. The maximum tumor thickness was approximately 2.8 cm a. The lateral margins were negative but the deep margin was positive as mentioned above. There was no LV I. or perineural invasion. The patient is a lytic and has some degree of mental retardation. He is being looked after by a close friend [name] [name], ph # [phone]) who claims to have healthcare power of attorney. He tells me that his legal guardian is [name] [name] (phone [phone]). I'm unable to reach her today.  PREVIOUS RADIATION THERAPY: No   PAST MEDICAL HISTORY:  has a past medical history of Seizures; Hypertension; Hyperlipemia; Diabetes mellitus; Mental disorder; GERD (gastroesophageal reflux disease); Polio; Skin cancer (04/06/11); Kidney  stones; Osteoporosis; Chronic kidney disease; Depression; Arthritis; and Asthma.     PAST SURGICAL HISTORY:  Past Surgical History  Procedure Date  . Cystoscopy/retrograde/ureteroscopy/stone extraction with basket   . Mass excision 04/06/2011    Procedure: EXCISION MASS;  Surgeon: Rosalio Macadamia;  Location: [facility];  Service: Plastics;  Laterality: N/A;  excision of squamous cell carcinoma right face with full thickness skin graft from left groin  . Total hip arthroplasty      FAMILY HISTORY: family history includes Cancer in his brother, father, mother, and sister.   SOCIAL HISTORY:  reports that he has never smoked. He has never used smokeless tobacco. He reports that he does not drink alcohol or use illicit drugs. He has been on disability his entire life. He lives by himself.   ALLERGIES: Ace inhibitors   MEDICATIONS:  Current Outpatient Prescriptions  Medication Sig Dispense Refill  . busPIRone (BUSPAR) 10 MG tablet Take 10 mg by mouth 4 (four) times daily.      . Choline Fenofibrate (TRILIPIX) 135 MG capsule Take 135 mg by mouth daily.      Marland Kitchen doxazosin (CARDURA) 4 MG tablet Take 4 mg by mouth 2 (two) times daily.        . metFORMIN (GLUCOPHAGE) 500 MG tablet Take 500 mg by mouth daily with breakfast.        . omeprazole (PRILOSEC) 20 MG capsule Take 20 mg by mouth 2 (two) times daily.        . phenytoin (DILANTIN) 100 MG ER capsule Take 300 mg by mouth 2 (two)  times daily.      . simvastatin (ZOCOR) 80 MG tablet Take 80 mg by mouth at bedtime.        . triamterene-hydrochlorothiazide (MAXZIDE) 75-50 MG per tablet Take 1 tablet by mouth daily.      . valsartan (DIOVAN) 80 MG tablet Take 80 mg by mouth daily.      Marland Kitchen amitriptyline (ELAVIL) 25 MG tablet Take 25 mg by mouth 4 (four) times daily.      . citalopram (CELEXA) 20 MG tablet Take 20 mg by mouth daily.      Marland Kitchen HYDROcodone-acetaminophen (LORTAB) 10-500 MG per tablet Take 1 tablet by mouth every 6  (six) hours as needed.        . zolpidem (AMBIEN) 10 MG tablet Take 10 mg by mouth at bedtime as needed.         REVIEW OF SYSTEMS:  Pertinent items are noted in HPI.    PHYSICAL EXAM:  height is 6\' 1"  (1.854 m) and weight is 228 lb 8 oz (103.647 kg). His oral temperature is 97.3 F (36.3 C). His blood pressure is 115/69 and his pulse is 76. His respiration is 18.   Head and neck examination: There is a 5.5 x 6.0 cm sunken defect with skin graft on the right temple. Along the lower mid aspect is a 2.0 x 1.2 cm area of slight crusting in thickening. There is no palpable periaricular,periparotid, or neck adenopathy. Oral cavity remarkable for numerous missing teeth. Remaining dentition in poor repair. Cranial nerves II through XII grossly intact.   LABORATORY DATA:  Lab Results  Component Value Date   WBC 7.2 04/03/2011   HGB 14.0 04/06/2011   HCT 44.0 04/03/2011   MCV 87.8 04/03/2011   PLT 212 04/03/2011   Lab Results  Component Value Date   NA 141 04/03/2011   K 4.0 04/03/2011   CL 105 04/03/2011   CO2 25 04/03/2011   No results found for this basename: ALT, AST, GGT, ALKPHOS, BILITOT      IMPRESSION: T2, N0, M0 squamous cell carcinoma of the skin, right temple. I explained to [name] [name] and his friend that he should undergo postoperative radiation therapy to hopefully eradicate almost certain residual microscopic disease. He does have some degree of renal insufficiency and I would like to obtain a MRI scan of his temporal region including his upper neck to make sure that there is no evidence for metastatic disease. This will also assist with this treatment planning. Care will be taken to avoid excessive dose to his right lacrimal gland with anticipated electron beam radiation therapy. I discussed the potential acute and late toxicities of radiation therapy in his healthcare power of attorney, [name] [name] signed his consent. Again, I will try to reach his legal guardian, [name] [name]. After his  MRI scans he'll return for treatment planning next week.   PLAN: As discussed above.   I spent 40 minutes minutes face to face with the patient and more than 50% of that time was spent in counseling and/or coordination of care.

## 2011-07-21 NOTE — Progress Notes (Signed)
Complete NUTRITION RISK SCREEN worksheet submitted to Zenovia Jarred, RD without concerns. Patient reports a 20 pound weight loss over the last year due to healthier eating habits. Complete PATIENT MEASURE OF DISTRESS worksheet with a score of 0 submitted to social work.

## 2011-07-21 NOTE — Progress Notes (Signed)
Patient presents to the clinic today accompanied by his friend, Crecencio Mc, for a new consult with Dr. Dayton Scrape reference skin ca. Patient is alert and oriented to person, place, and time. No distress noted. Slow steady gait noted with assistance of cane. Flat affect noted. Speech garbled. Patient looks to [place] prior to responding to any questions asked. Patient responds appropriately to questions. Edwyna Shell reports that the patient lives alone and he lives across the street from him. Edwyna Shell goes on to further explain the patient does not read, write or drive. Patient reports that "they have been dealing with this skin cancer for a year now." Skin of patient's right temple healed. Patient reports he had surgery April 06, 2011. Patient denies nausea, vomiting, headache, dizziness or diarrhea. Edwyna Shell reports the patient has lost 20 pounds in one year but that his medical doctor instructed him to do so. Patient reports eating and sleeping without difficulty. Patient has no complaints at this time. Reported all findings to Dr. Dayton Scrape.  AX:ace inhibitors causes a cough Denies having a pacemaker No hx of radiation

## 2011-07-21 NOTE — Progress Notes (Signed)
Please see progress note under physician's encounter.

## 2011-07-23 NOTE — Progress Notes (Signed)
Encounter addended by: Agnes Lawrence, RN on: 07/23/2011  3:06 PM<BR>     Documentation filed: Charges VN, Inpatient Patient Education

## 2011-07-24 ENCOUNTER — Other Ambulatory Visit (HOSPITAL_COMMUNITY): Payer: Medicaid Other

## 2011-07-24 ENCOUNTER — Ambulatory Visit (HOSPITAL_COMMUNITY)
Admission: RE | Admit: 2011-07-24 | Discharge: 2011-07-24 | Disposition: A | Discharge status: Home / Self Care | Payer: Medicaid Other | Source: Ambulatory Visit | Attending: Radiation Oncology | Admitting: Radiation Oncology

## 2011-07-24 ENCOUNTER — Ambulatory Visit (HOSPITAL_COMMUNITY): Admission: RE | Admit: 2011-07-24 | Payer: Medicaid Other | Source: Ambulatory Visit

## 2011-07-24 ENCOUNTER — Other Ambulatory Visit: Payer: Self-pay | Admitting: Radiation Oncology

## 2011-07-24 DIAGNOSIS — C44320 Squamous cell carcinoma of skin of unspecified parts of face: Secondary | ICD-10-CM

## 2011-07-24 DIAGNOSIS — IMO0002 Reserved for concepts with insufficient information to code with codable children: Secondary | ICD-10-CM

## 2011-07-24 DIAGNOSIS — Z1389 Encounter for screening for other disorder: Secondary | ICD-10-CM | POA: Insufficient documentation

## 2011-07-26 ENCOUNTER — Inpatient Hospital Stay (HOSPITAL_COMMUNITY): Admission: RE | Admit: 2011-07-26 | Payer: Medicaid Other | Source: Ambulatory Visit

## 2011-07-26 ENCOUNTER — Other Ambulatory Visit (HOSPITAL_COMMUNITY): Payer: Medicaid Other

## 2011-07-27 ENCOUNTER — Ambulatory Visit
Admission: RE | Admit: 2011-07-27 | Discharge: 2011-07-27 | Disposition: A | Discharge status: Home / Self Care | Payer: Medicaid Other | Source: Ambulatory Visit | Attending: Radiation Oncology | Admitting: Radiation Oncology

## 2011-07-27 ENCOUNTER — Other Ambulatory Visit: Payer: Self-pay | Admitting: Radiation Oncology

## 2011-07-27 DIAGNOSIS — C4490 Unspecified malignant neoplasm of skin, unspecified: Secondary | ICD-10-CM

## 2011-07-27 LAB — POCT I-STAT, CHEM 8
BUN: 24 mg/dL — ABNORMAL HIGH (ref 6–23)
Calcium, Ion: 1.25 mmol/L (ref 1.12–1.32)
Creatinine, Ser: 1.5 mg/dL — ABNORMAL HIGH (ref 0.50–1.35)
Glucose, Bld: 109 mg/dL — ABNORMAL HIGH (ref 70–99)
Sodium: 144 mEq/L (ref 135–145)
TCO2: 25 mmol/L (ref 0–100)

## 2011-07-27 NOTE — Progress Notes (Signed)
Simulation/treatment planning note:  The patient was taken to the CT simulator. A head cast was constructed for immobilization. I marked his treatment field with a radiopaque wire. He was then scanned. I contoured his field block. I also contoured his right eye and lacrimal gland. I prescribing 6000 cGy in 30 sessions utilizing 6 MEV electrons. 0.8 cm of custom bolus will be constructed to maximize the dose to the skin surface and to minimize dose to the lacrimal gland. I am requesting a special physics consult to estimate the dose to the right lacrimal gland. One custom block was constructed and a special port plan is requested. He'll begin his radiation therapy after completion of staging with contrast CT scans. Is not a candidate for MRI scan because of the metallic fragment within the right orbit.

## 2011-07-27 NOTE — Progress Notes (Signed)
Met with patient's friend, Fransisca Connors, to go over billing and gave epp and [facility] application.  Patient can not read or write so Mr. Gaynell Face will go over information with Mr. Spadafore.

## 2011-07-30 ENCOUNTER — Other Ambulatory Visit (HOSPITAL_COMMUNITY): Payer: Medicaid Other

## 2011-07-30 ENCOUNTER — Ambulatory Visit (HOSPITAL_COMMUNITY)
Admission: RE | Admit: 2011-07-30 | Discharge: 2011-07-30 | Disposition: A | Discharge status: Home / Self Care | Payer: Medicaid Other | Source: Ambulatory Visit | Attending: Radiation Oncology | Admitting: Radiation Oncology

## 2011-07-30 ENCOUNTER — Other Ambulatory Visit: Payer: Self-pay | Admitting: Radiation Oncology

## 2011-07-30 ENCOUNTER — Encounter (HOSPITAL_COMMUNITY): Payer: Self-pay

## 2011-07-30 ENCOUNTER — Encounter: Payer: Self-pay | Admitting: Radiation Oncology

## 2011-07-30 DIAGNOSIS — J9819 Other pulmonary collapse: Secondary | ICD-10-CM | POA: Insufficient documentation

## 2011-07-30 DIAGNOSIS — C44320 Squamous cell carcinoma of skin of unspecified parts of face: Secondary | ICD-10-CM | POA: Insufficient documentation

## 2011-07-30 DIAGNOSIS — C4490 Unspecified malignant neoplasm of skin, unspecified: Secondary | ICD-10-CM

## 2011-07-30 MED ORDER — IOHEXOL 300 MG/ML  SOLN
100.0000 mL | Freq: Once | INTRAMUSCULAR | Status: AC | PRN
  Administered 2011-07-30: 100 mL via INTRAVENOUS

## 2011-07-30 NOTE — Progress Notes (Signed)
Chart note: I spoke with the patient's power of attorney, Letitia Neri [phone]) performs me that his friend, Edwyna Shell, also has healthcare power of attorney to make medical decisions.

## 2011-08-03 ENCOUNTER — Encounter: Payer: Self-pay | Admitting: Radiation Oncology

## 2011-08-04 ENCOUNTER — Ambulatory Visit
Admission: RE | Admit: 2011-08-04 | Discharge: 2011-08-04 | Disposition: A | Discharge status: Home / Self Care | Payer: Medicaid Other | Source: Ambulatory Visit | Attending: Radiation Oncology | Admitting: Radiation Oncology

## 2011-08-05 ENCOUNTER — Ambulatory Visit
Admission: RE | Admit: 2011-08-05 | Discharge: 2011-08-05 | Disposition: A | Discharge status: Home / Self Care | Payer: Medicaid Other | Source: Ambulatory Visit | Attending: Radiation Oncology | Admitting: Radiation Oncology

## 2011-08-05 DIAGNOSIS — C4490 Unspecified malignant neoplasm of skin, unspecified: Secondary | ICD-10-CM

## 2011-08-05 NOTE — Progress Notes (Signed)
Machine behind. Visited with patient and his friend, Edwyna Shell, in dressing room while awaiting treatment on machine. Provided patient and Edwyna Shell with post sim education. Patient is alert and oriented to person, place, and time. No distress noted. Patient sitting up in wheelchair. Patient denies pain at this time. Oriented patient and Edwyna Shell to staff and routine of the clinic. Reviewed potential side effects and management. Provided [place] with Radiation Therapy [facility] handbook then, reviewed pertinent information. Provided Edwyna Shell with this writer's business card and encouraged to call with needs. Both verbalized understanding of all things reviewed.

## 2011-08-06 ENCOUNTER — Ambulatory Visit
Admission: RE | Admit: 2011-08-06 | Discharge: 2011-08-06 | Disposition: A | Discharge status: Home / Self Care | Payer: Medicaid Other | Source: Ambulatory Visit | Attending: Radiation Oncology | Admitting: Radiation Oncology

## 2011-08-07 ENCOUNTER — Ambulatory Visit
Admission: RE | Admit: 2011-08-07 | Discharge: 2011-08-07 | Disposition: A | Discharge status: Home / Self Care | Payer: Medicaid Other | Source: Ambulatory Visit | Attending: Radiation Oncology | Admitting: Radiation Oncology

## 2011-08-10 ENCOUNTER — Encounter: Payer: Self-pay | Admitting: Radiation Oncology

## 2011-08-10 ENCOUNTER — Ambulatory Visit
Admission: RE | Admit: 2011-08-10 | Discharge: 2011-08-10 | Disposition: A | Discharge status: Home / Self Care | Payer: Medicaid Other | Source: Ambulatory Visit | Attending: Radiation Oncology | Admitting: Radiation Oncology

## 2011-08-10 VITALS — BP 123/65 | HR 74 | Temp 97.1°F | Resp 20 | Wt 234.9 lb

## 2011-08-10 DIAGNOSIS — C4490 Unspecified malignant neoplasm of skin, unspecified: Secondary | ICD-10-CM

## 2011-08-10 NOTE — Progress Notes (Signed)
Weekly Management Note:  Site:R Temple Current Dose:  1000  cGy Projected Dose: 6000  cGy  Narrative: The patient is seen today for routine under treatment assessment. CBCT/MVCT images/port films were reviewed. The chart was reviewed.   The patient and his friend note a "new knot" along that anterior aspect of his external ear canal. His friend states that this is new finding. I did not document the presence of any mass within the ear canal I saw him in consultation.  Physical Examination:  Filed Vitals:   08/10/11 1245  BP: 123/65  Pulse: 74  Temp: 97.1 F (36.2 C)  Resp: 20  .  Weight: 234 lb 14.4 oz (106.55 kg). There is a 7-8 mm nevus appearing mass along the anterior aspect of his right anterior canal. This appears to be a nevus which I had not previously documented. There is no palpable periaricular or neck adenopathy.  Impression: Tolerating radiation therapy well, I suspect that he has a benign nevus within his external ear canal, but I would like for Dr. Shon Hough to confirm this. This may require a biopsy.  Plan: Continue radiation therapy as planned. I spoke with Dr. Shon Hough and he will try to see him this week for a brief followup visit.

## 2011-08-10 NOTE — Progress Notes (Signed)
Pt's friend reports pt's right eye has begun watering again since starting radiation. He states pt's eye was watering last year but had stopped. Friend also pointed out knot inside pt's right ear which was not present before. Pt states it "feels sore"., denies drainage from ear. Pt denies pain, loss of appetite.

## 2011-08-11 ENCOUNTER — Ambulatory Visit
Admission: RE | Admit: 2011-08-11 | Discharge: 2011-08-11 | Disposition: A | Discharge status: Home / Self Care | Payer: Medicaid Other | Source: Ambulatory Visit | Attending: Radiation Oncology | Admitting: Radiation Oncology

## 2011-08-12 ENCOUNTER — Ambulatory Visit
Admission: RE | Admit: 2011-08-12 | Discharge: 2011-08-12 | Disposition: A | Discharge status: Home / Self Care | Payer: Medicaid Other | Source: Ambulatory Visit | Attending: Radiation Oncology | Admitting: Radiation Oncology

## 2011-08-13 ENCOUNTER — Ambulatory Visit
Admission: RE | Admit: 2011-08-13 | Discharge: 2011-08-13 | Disposition: A | Discharge status: Home / Self Care | Payer: Medicaid Other | Source: Ambulatory Visit | Attending: Radiation Oncology | Admitting: Radiation Oncology

## 2011-08-14 ENCOUNTER — Ambulatory Visit
Admission: RE | Admit: 2011-08-14 | Discharge: 2011-08-14 | Disposition: A | Discharge status: Home / Self Care | Payer: Medicaid Other | Source: Ambulatory Visit | Attending: Radiation Oncology | Admitting: Radiation Oncology

## 2011-08-17 ENCOUNTER — Ambulatory Visit
Admission: RE | Admit: 2011-08-17 | Discharge: 2011-08-17 | Disposition: A | Discharge status: Home / Self Care | Payer: Medicaid Other | Source: Ambulatory Visit | Attending: Radiation Oncology | Admitting: Radiation Oncology

## 2011-08-17 VITALS — Temp 97.0°F | Resp 20 | Wt 230.8 lb

## 2011-08-17 DIAGNOSIS — C4490 Unspecified malignant neoplasm of skin, unspecified: Secondary | ICD-10-CM

## 2011-08-17 NOTE — Progress Notes (Signed)
Pt denies pain, fatigue, loss of appetite. Pt denies irritation of skin in tx area, right ear. Advised him if he develops skin irritation, RN will give him lotion to apply.  Seeing Dr Shon Hough 08/19/11 re: nodule inside right ear.

## 2011-08-17 NOTE — Progress Notes (Signed)
Weekly Management Note:  Site:R temple Current Dose:  2000  cGy Projected Dose: 6600  cGy  Narrative: The patient is seen today for routine under treatment assessment. CBCT/MVCT images/port films were reviewed. The chart was reviewed.   No complaints today. He is not using any skin preparations. He'll see Dr. Shon Hough this Wednesday for a "new knot" it appears to be a nevus within the external ear canal..  Physical Examination:  Filed Vitals:   08/17/11 1416  Temp: 97 F (36.1 C)  Resp: 20  .  Weight: 230 lb 12.8 oz (104.69 kg). There is mild erythema the skin within the treatment field with no areas of desquamation. The external ear canal "nevus" appears to be unchanged.  Impression: Tolerating radiation therapy well.  Plan: Continue radiation therapy as planned.

## 2011-08-18 ENCOUNTER — Ambulatory Visit
Admission: RE | Admit: 2011-08-18 | Discharge: 2011-08-18 | Disposition: A | Discharge status: Home / Self Care | Payer: Medicaid Other | Source: Ambulatory Visit | Attending: Radiation Oncology | Admitting: Radiation Oncology

## 2011-08-19 ENCOUNTER — Ambulatory Visit
Admission: RE | Admit: 2011-08-19 | Discharge: 2011-08-19 | Disposition: A | Discharge status: Home / Self Care | Payer: Medicaid Other | Source: Ambulatory Visit | Attending: Radiation Oncology | Admitting: Radiation Oncology

## 2011-08-20 ENCOUNTER — Ambulatory Visit
Admission: RE | Admit: 2011-08-20 | Discharge: 2011-08-20 | Disposition: A | Discharge status: Home / Self Care | Payer: Medicaid Other | Source: Ambulatory Visit | Attending: Radiation Oncology | Admitting: Radiation Oncology

## 2011-08-21 ENCOUNTER — Ambulatory Visit
Admission: RE | Admit: 2011-08-21 | Discharge: 2011-08-21 | Disposition: A | Discharge status: Home / Self Care | Payer: Medicaid Other | Source: Ambulatory Visit | Attending: Radiation Oncology | Admitting: Radiation Oncology

## 2011-08-24 ENCOUNTER — Encounter: Payer: Self-pay | Admitting: Radiation Oncology

## 2011-08-24 ENCOUNTER — Ambulatory Visit
Admission: RE | Admit: 2011-08-24 | Discharge: 2011-08-24 | Disposition: A | Discharge status: Home / Self Care | Payer: Medicaid Other | Source: Ambulatory Visit | Attending: Radiation Oncology | Admitting: Radiation Oncology

## 2011-08-24 VITALS — BP 116/69 | HR 78 | Temp 97.1°F | Resp 20 | Wt 227.3 lb

## 2011-08-24 DIAGNOSIS — C4490 Unspecified malignant neoplasm of skin, unspecified: Secondary | ICD-10-CM

## 2011-08-24 NOTE — Progress Notes (Signed)
Weekly Management Note:  Site:R temple Current Dose:  3000  cGy Projected Dose: 6600  cGy  Narrative: The patient is seen today for routine under treatment assessment. CBCT/MVCT images/port films were reviewed. The chart was reviewed.   He is without complaints today. He is not using any skin care preparations. He was seen by Dr. Shon Hough last week and his friend tells me that Dr. Shon Hough was not concerned about what appears to be a mole within his external ear canal.  Physical Examination:  Filed Vitals:   08/24/11 1425  BP: 116/69  Pulse: 78  Temp: 97.1 F (36.2 C)  Resp: 20  .  Weight: 227 lb 4.8 oz (103.103 kg). There is moderate erythema the skin with patchy dry desquamation. Otherwise, no change .  Impression: Tolerating radiation therapy well.  Plan: Continue radiation therapy as planned.

## 2011-08-24 NOTE — Progress Notes (Signed)
Pt denies pain, fatigue, loss of appetite. Right temple hyperpigmented; pt denies itching, irritation.

## 2011-08-25 ENCOUNTER — Ambulatory Visit
Admission: RE | Admit: 2011-08-25 | Discharge: 2011-08-25 | Disposition: A | Discharge status: Home / Self Care | Payer: Medicaid Other | Source: Ambulatory Visit | Attending: Radiation Oncology | Admitting: Radiation Oncology

## 2011-08-26 ENCOUNTER — Ambulatory Visit
Admission: RE | Admit: 2011-08-26 | Discharge: 2011-08-26 | Disposition: A | Discharge status: Home / Self Care | Payer: Medicaid Other | Source: Ambulatory Visit | Attending: Radiation Oncology | Admitting: Radiation Oncology

## 2011-08-27 ENCOUNTER — Encounter: Payer: Self-pay | Admitting: Radiation Oncology

## 2011-08-27 ENCOUNTER — Ambulatory Visit
Admission: RE | Admit: 2011-08-27 | Discharge: 2011-08-27 | Disposition: A | Discharge status: Home / Self Care | Payer: Medicaid Other | Source: Ambulatory Visit | Attending: Radiation Oncology | Admitting: Radiation Oncology

## 2011-08-27 NOTE — Progress Notes (Signed)
Weekly Management Note:  Site:R temple Current Dose:  3600  cGy Projected Dose: 6600  cGy  Narrative: The patient is seen today for routine under treatment assessment. CBCT/MVCT images/port films were reviewed. The chart was reviewed.   He is without complaints today.  Physical Examination: There were no vitals filed for this visit..  Weight:  . There is moderate erythema the skin along the right temple with patchy dry desquamation.  Impression: Tolerating radiation therapy well.  Plan: Continue radiation therapy as planned. I will perform a clinical electron beam simulation week of June 3 for his boost.

## 2011-08-28 ENCOUNTER — Ambulatory Visit
Admission: RE | Admit: 2011-08-28 | Discharge: 2011-08-28 | Disposition: A | Discharge status: Home / Self Care | Payer: Medicaid Other | Source: Ambulatory Visit | Attending: Radiation Oncology | Admitting: Radiation Oncology

## 2011-09-01 ENCOUNTER — Ambulatory Visit
Admission: RE | Admit: 2011-09-01 | Discharge: 2011-09-01 | Disposition: A | Discharge status: Home / Self Care | Payer: Medicaid Other | Source: Ambulatory Visit | Attending: Radiation Oncology | Admitting: Radiation Oncology

## 2011-09-02 ENCOUNTER — Ambulatory Visit
Admission: RE | Admit: 2011-09-02 | Discharge: 2011-09-02 | Disposition: A | Discharge status: Home / Self Care | Payer: Medicaid Other | Source: Ambulatory Visit | Attending: Radiation Oncology | Admitting: Radiation Oncology

## 2011-09-02 ENCOUNTER — Encounter: Payer: Self-pay | Admitting: Radiation Oncology

## 2011-09-02 VITALS — BP 119/68 | HR 79 | Resp 18 | Wt 227.4 lb

## 2011-09-02 DIAGNOSIS — C4490 Unspecified malignant neoplasm of skin, unspecified: Secondary | ICD-10-CM

## 2011-09-02 MED ORDER — RADIAPLEXRX EX GEL
Freq: Once | CUTANEOUS | Status: AC
  Administered 2011-09-02: 1 via TOPICAL

## 2011-09-02 NOTE — Progress Notes (Signed)
   Weekly Management Note: Skin cancer, right temple Current Dose:   4200cGy  Projected Dose: 6600 cGy   Narrative:  The patient presents for routine under treatment assessment.  CBCT/MVCT images/Port film x-rays were reviewed.  The chart was checked. His right eye is starting to water. He is concerned about the mole in his ear canal.  Physical Findings: Weight: 227 lb 6.4 oz (103.148 kg). He has a mole that appears benign in his ear canal. It is raised but not hyperpigmented. He has concavity with diffuse dry desquamation over his temple and ear on the right. His eye is non-erythematous without excessive tearing  Impression:  The patient is tolerating radiotherapy.  Plan:  Continue radiotherapy as planned. I recommended artificial tears and gave the patient a prescription to help with his insurance reimbursement. I also got the patient a tube of radiaplex for his skin instructed him on how to use it. I reassured him that the mole appears benign but if it shows evidence of progression at his one-month followup, a referral can be made to dermatology

## 2011-09-02 NOTE — Progress Notes (Signed)
Patient presents to the clinic today accompanied by his friend for an under treat visit with Dr. Basilio Cairo. Patient is alert and oriented to person, place, and time. No distress noted. Patient being pushed in a wheelchair due to generalized weakness. Patient able to walk short distances with cane. Pleasant talkative affect noted. Patient denies pain at this time. Dry patch desquamation noted along right temple. Patient denies using skin care products at this time. Patient continues to be concern about mole in right ear canal. Patient questioning if he should see a dermatologist for biopsy. Also, patient reports his eye frequently runs water. Patient denies eye pain or diplopia. Reported all findings to Dr. Basilio Cairo.

## 2011-09-02 NOTE — Progress Notes (Signed)
Encounter addended by: Delynn Flavin, RN on: 09/02/2011  6:48 PM<BR>     Documentation filed: Inpatient MAR

## 2011-09-02 NOTE — Progress Notes (Signed)
Encounter addended by: Delynn Flavin, RN on: 09/02/2011  6:47 PM<BR>     Documentation filed: Orders

## 2011-09-03 ENCOUNTER — Ambulatory Visit
Admission: RE | Admit: 2011-09-03 | Discharge: 2011-09-03 | Disposition: A | Discharge status: Home / Self Care | Payer: Medicaid Other | Source: Ambulatory Visit | Attending: Radiation Oncology | Admitting: Radiation Oncology

## 2011-09-04 ENCOUNTER — Ambulatory Visit
Admission: RE | Admit: 2011-09-04 | Discharge: 2011-09-04 | Disposition: A | Discharge status: Home / Self Care | Payer: Medicaid Other | Source: Ambulatory Visit | Attending: Radiation Oncology | Admitting: Radiation Oncology

## 2011-09-07 ENCOUNTER — Encounter: Payer: Self-pay | Admitting: Radiation Oncology

## 2011-09-07 ENCOUNTER — Ambulatory Visit
Admission: RE | Admit: 2011-09-07 | Discharge: 2011-09-07 | Disposition: A | Discharge status: Home / Self Care | Payer: Medicaid Other | Source: Ambulatory Visit | Attending: Radiation Oncology | Admitting: Radiation Oncology

## 2011-09-07 VITALS — BP 101/64 | HR 63 | Temp 97.6°F | Resp 20 | Wt 228.3 lb

## 2011-09-07 DIAGNOSIS — C4490 Unspecified malignant neoplasm of skin, unspecified: Secondary | ICD-10-CM

## 2011-09-07 NOTE — Progress Notes (Signed)
Weekly Management Note:  Site:R Temple Current Dose:  4800  cGy Projected Dose: 6600  cGy  Narrative: The patient is seen today for routine under treatment assessment. CBCT/MVCT images/port films were reviewed. The chart was reviewed.   He is without complaints today although he does admit to pruritus from dry skin along his right temple/treatment region. He was given Radioplex gel to use when necessary. Electron beam simulation performed earlier today.  Physical Examination:  Filed Vitals:   09/07/11 1443  BP: 101/64  Pulse: 63  Temp: 97.6 F (36.4 C)  Resp: 20  .  Weight: 228 lb 4.8 oz (103.556 kg). There is marked hyperpigmentation with patchy dry desquamation the skin along the right temple. There are no areas of moist desquamation.  Impression: Tolerating radiation therapy well.  Plan: Continue radiation therapy as planned.

## 2011-09-07 NOTE — Progress Notes (Signed)
Electron beam simulation note:  The patient underwent electron beam simulation for his reduced right temporal field boost. He was set up to the same gantry angle and head cast. I marked on his skin the reduced field for treatment taking a 1 cm border around his skin graft. One custom block is constructed to conform the field. We will continue with the same bolus. I prescribing a further 600 cGy in 3 sessions, again utilizing 6 MEV electrons.

## 2011-09-07 NOTE — Progress Notes (Signed)
Patient alert,oriented x3, right eardry peeling, desquamation, erythema, right templw, hasn't started using radiaplex gel as yet, went over instructiosn again to start , use after radiation dialy and prn not 4 hours prior to rad tx, completed 24/33 txs, no c/o pain or discomfort, no itching2:47 PM

## 2011-09-08 ENCOUNTER — Ambulatory Visit
Admission: RE | Admit: 2011-09-08 | Discharge: 2011-09-08 | Disposition: A | Discharge status: Home / Self Care | Payer: Medicaid Other | Source: Ambulatory Visit | Attending: Radiation Oncology | Admitting: Radiation Oncology

## 2011-09-09 ENCOUNTER — Ambulatory Visit
Admission: RE | Admit: 2011-09-09 | Discharge: 2011-09-09 | Disposition: A | Discharge status: Home / Self Care | Payer: Medicaid Other | Source: Ambulatory Visit | Attending: Radiation Oncology | Admitting: Radiation Oncology

## 2011-09-10 ENCOUNTER — Ambulatory Visit
Admission: RE | Admit: 2011-09-10 | Discharge: 2011-09-10 | Disposition: A | Discharge status: Home / Self Care | Payer: Medicaid Other | Source: Ambulatory Visit | Attending: Radiation Oncology | Admitting: Radiation Oncology

## 2011-09-11 ENCOUNTER — Ambulatory Visit
Admission: RE | Admit: 2011-09-11 | Discharge: 2011-09-11 | Disposition: A | Discharge status: Home / Self Care | Payer: Medicaid Other | Source: Ambulatory Visit | Attending: Radiation Oncology | Admitting: Radiation Oncology

## 2011-09-14 ENCOUNTER — Encounter: Payer: Self-pay | Admitting: Radiation Oncology

## 2011-09-14 ENCOUNTER — Ambulatory Visit
Admission: RE | Admit: 2011-09-14 | Discharge: 2011-09-14 | Disposition: A | Discharge status: Home / Self Care | Payer: Medicaid Other | Source: Ambulatory Visit | Attending: Radiation Oncology | Admitting: Radiation Oncology

## 2011-09-14 VITALS — BP 95/62 | HR 81 | Resp 20 | Wt 229.9 lb

## 2011-09-14 DIAGNOSIS — C4490 Unspecified malignant neoplasm of skin, unspecified: Secondary | ICD-10-CM

## 2011-09-14 NOTE — Progress Notes (Signed)
Pt denies pain, loss of appetite, does have some fatigue. Applying Radiaplex to right temple.

## 2011-09-14 NOTE — Progress Notes (Signed)
Weekly Management Note:  Site:R Temple Current Dose:  5800  cGy Projected Dose: 6600  cGy  Narrative: The patient is seen today for routine under treatment assessment. CBCT/MVCT images/port films were reviewed. The chart was reviewed.   No complaints today. He uses Radioplex gel twice a day.  Physical Examination:  Filed Vitals:   09/14/11 1404  BP: 95/62  Pulse: 81  Resp: 20  .  Weight: 229 lb 14.4 oz (104.282 kg). There is hyperpigmentation the skin along with dry desquamation. No areas of moist desquamation.  Impression: Tolerating radiation therapy well.  Plan: Continue radiation therapy as planned.

## 2011-09-15 ENCOUNTER — Ambulatory Visit
Admission: RE | Admit: 2011-09-15 | Discharge: 2011-09-15 | Disposition: A | Discharge status: Home / Self Care | Payer: Medicaid Other | Source: Ambulatory Visit | Attending: Radiation Oncology | Admitting: Radiation Oncology

## 2011-09-16 ENCOUNTER — Ambulatory Visit
Admission: RE | Admit: 2011-09-16 | Discharge: 2011-09-16 | Disposition: A | Discharge status: Home / Self Care | Payer: Medicaid Other | Source: Ambulatory Visit | Attending: Radiation Oncology | Admitting: Radiation Oncology

## 2011-09-17 ENCOUNTER — Encounter: Payer: Self-pay | Admitting: Radiation Oncology

## 2011-09-17 ENCOUNTER — Ambulatory Visit
Admission: RE | Admit: 2011-09-17 | Discharge: 2011-09-17 | Disposition: A | Discharge status: Home / Self Care | Payer: Medicaid Other | Source: Ambulatory Visit | Attending: Radiation Oncology | Admitting: Radiation Oncology

## 2011-09-17 VITALS — BP 106/66 | HR 69 | Temp 97.7°F | Resp 20

## 2011-09-17 DIAGNOSIS — C4490 Unspecified malignant neoplasm of skin, unspecified: Secondary | ICD-10-CM

## 2011-09-17 NOTE — Progress Notes (Signed)
Pt will complete radiation tx tomorrow; has FU appt scheduled. Pt denies pain, loss of appetite, does report fatigue. Applying Radiaplex to tx area on right temple. Advised to cont w/lotion bid x 2-3 weeks.

## 2011-09-17 NOTE — Progress Notes (Signed)
Weekly Management Note:  Site:R Temple Current Dose:  6400  cGy Projected Dose: 6600  cGy  Narrative: The patient is seen today for routine under treatment assessment. CBCT/MVCT images/port films were reviewed. The chart was reviewed.   No new complaints today. He continues with Radioplex gel.  Physical Examination:  Filed Vitals:   09/17/11 1414  BP: 106/66  Pulse: 69  Temp: 97.7 F (36.5 C)  Resp: 20  .  Weight:  . There is marked hyperpigmentation, moderate erythema and dry desquamation within the treatment field. No areas of moist desquamation.  Impression: Tolerating radiation therapy well.  Plan: Continue radiation therapy as planned. He'll finish his radiation therapy tomorrow and return here for a one-month followup visit.

## 2011-09-18 ENCOUNTER — Ambulatory Visit
Admission: RE | Admit: 2011-09-18 | Discharge: 2011-09-18 | Disposition: A | Discharge status: Home / Self Care | Payer: Medicaid Other | Source: Ambulatory Visit | Attending: Radiation Oncology | Admitting: Radiation Oncology

## 2011-09-21 ENCOUNTER — Encounter: Payer: Self-pay | Admitting: Radiation Oncology

## 2011-09-21 NOTE — Progress Notes (Signed)
[facility] Radiation Oncology End of Treatment Note  Name:[name] [name]  Date: 09/21/2011 MVH:[id number] DOB:Jan 30, 1946   Status:outpatient    CC: Dr. [name]  REFERRING PHYSICIAN:   Dr. 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 Humphrey tolerated treatment well with marked hyperpigmentation, and moderate erythema the skin by completion of therapy. He had diffuse dry desquamation but no areas of moist desquamation by completion of therapy. He used Radioplex gel during his course of treatment.                          PLAN: Routine followup in one month. Patient instructed to call if questions or worsening complaints in interim.

## 2011-10-13 ENCOUNTER — Ambulatory Visit: Payer: Medicaid Other | Admitting: Radiation Oncology

## 2011-10-16 ENCOUNTER — Encounter: Payer: Self-pay | Admitting: Radiation Oncology

## 2011-10-27 ENCOUNTER — Encounter: Payer: Self-pay | Admitting: Radiation Oncology

## 2011-10-27 ENCOUNTER — Ambulatory Visit
Admission: RE | Admit: 2011-10-27 | Discharge: 2011-10-27 | Disposition: A | Discharge status: Home / Self Care | Payer: Medicaid Other | Source: Ambulatory Visit | Attending: Radiation Oncology | Admitting: Radiation Oncology

## 2011-10-27 VITALS — BP 110/72 | HR 85 | Temp 97.6°F | Resp 18 | Wt 226.8 lb

## 2011-10-27 DIAGNOSIS — C4490 Unspecified malignant neoplasm of skin, unspecified: Secondary | ICD-10-CM

## 2011-10-27 NOTE — Progress Notes (Signed)
Followup note:  [name] [name] returns today approximately 5 weeks following completion of postoperative electron beam radiation therapy in the management of his pathologic Stage T2 N0 squamous cell carcinoma the skin along the right temple. He is without complaints today. He saw Dr. Shon Hough just following his treatment and he will see him again in October.  Physical examination: Alert and oriented. On inspection of his right temple there is residual hyperpigmentation of the skin along with adjacent alopecia. There is patchy dry desquamation with areas of hyperkeratosis. No visible or palpable evidence for persistent/recurrent disease.  Impression: Satisfactory progress.   Plan: The patient see Dr. Shon Hough in October and I'll see him back for a followup visit in 6 months.

## 2011-10-27 NOTE — Progress Notes (Signed)
Patient presents to the clinic today accompanied by his friend for a follow up appointment with Dr. Dayton Scrape. Patient is alert and oriented to person, place, and time. No distress noted. Patient being pushed in wheelchair. Pleasant affect noted. Patient smiling. Patient denies pain at this time. Patient denies diminished hearing in right ear. Patient denies dry eye or diminished vision. Patient denies headache. Patient denies nausea, vomiting, dizziness and diarrhea. Only a small amount of dry desquamation noted to right temple. Patient reports he continues to use Radiaplex. Patient has no complaints at this time. Patient to see Dr. Aurelio Jew in October. Reported all findings to Dr. Dayton Scrape.

## 2012-04-25 DIAGNOSIS — N189 Chronic kidney disease, unspecified: Secondary | ICD-10-CM | POA: Insufficient documentation

## 2012-04-25 DIAGNOSIS — I10 Essential (primary) hypertension: Secondary | ICD-10-CM | POA: Insufficient documentation

## 2012-04-25 DIAGNOSIS — K219 Gastro-esophageal reflux disease without esophagitis: Secondary | ICD-10-CM | POA: Insufficient documentation

## 2012-04-25 DIAGNOSIS — M1990 Unspecified osteoarthritis, unspecified site: Secondary | ICD-10-CM | POA: Insufficient documentation

## 2012-04-25 DIAGNOSIS — E785 Hyperlipidemia, unspecified: Secondary | ICD-10-CM | POA: Insufficient documentation

## 2012-04-25 DIAGNOSIS — F329 Major depressive disorder, single episode, unspecified: Secondary | ICD-10-CM | POA: Insufficient documentation

## 2012-04-25 DIAGNOSIS — Z923 Personal history of irradiation: Secondary | ICD-10-CM | POA: Insufficient documentation

## 2012-04-26 ENCOUNTER — Ambulatory Visit
Admission: RE | Admit: 2012-04-26 | Discharge: 2012-04-26 | Disposition: A | Discharge status: Home / Self Care | Payer: Medicaid Other | Source: Ambulatory Visit | Attending: Radiation Oncology | Admitting: Radiation Oncology

## 2012-04-26 ENCOUNTER — Encounter: Payer: Self-pay | Admitting: Radiation Oncology

## 2012-04-26 VITALS — BP 124/71 | HR 83 | Temp 97.9°F | Resp 20 | Wt 224.4 lb

## 2012-04-26 DIAGNOSIS — C4490 Unspecified malignant neoplasm of skin, unspecified: Secondary | ICD-10-CM

## 2012-04-26 NOTE — Progress Notes (Signed)
CC:  Dr. [name], Dr. Ralene Ok  Followup note:  [name] [name] returns today approximately 7 months following completion of electron beam radiation therapy in the management of his T2 N0 squamous cell carcinoma of the skin involving the right temple. He is without complaints today. He has not seen Dr. Shon Hough in the recent past. He inquires as to whether not he can have reconstructive surgery on the right temple.  Physical examination: There is a surgical defect with graft along the right temple/preauricular region. There is no visible or palpable evidence for recurrent disease. There is a 1 cm area of erythematous hyperkeratosis just anterior to the tragus. There is a 1.5 cm area of erythematous keratosis along his right helix. There is no palpable periaricular or cervical adenopathy.  Impression: No evidence for recurrent disease. I am concerned about his right ear helix which may represent an actinic keratosis or early skin cancer. I told his family that I recommend that he see a dermatologist of his choice.  Plan: Followup visit with me in approximately 4 months. I do not feel that he should have any type of reconstructive surgery for least 2-3 years until he is beyond a significant risk for recurrent disease.

## 2012-04-26 NOTE — Progress Notes (Signed)
Patient here follow up right temple skin 08/04/11-09/18/11 Patient in w/c, alert,oriented x3, no c/o pain, nausea or blurred vision nor headaches, there is 2 small areas inside top of his ear that just came up recently, states it burns and itches 3:11 PM

## 2012-08-23 ENCOUNTER — Ambulatory Visit: Payer: Medicaid Other | Admitting: Radiation Oncology

## 2012-08-24 ENCOUNTER — Ambulatory Visit: Payer: Medicaid Other | Admitting: Radiation Oncology

## 2012-08-30 ENCOUNTER — Ambulatory Visit
Admission: RE | Admit: 2012-08-30 | Discharge: 2012-08-30 | Disposition: A | Discharge status: Home / Self Care | Payer: Medicaid Other | Source: Ambulatory Visit | Attending: Radiation Oncology | Admitting: Radiation Oncology

## 2012-08-30 VITALS — BP 109/62 | HR 80 | Temp 97.3°F | Wt 222.5 lb

## 2012-08-30 DIAGNOSIS — C4490 Unspecified malignant neoplasm of skin, unspecified: Secondary | ICD-10-CM

## 2012-08-30 NOTE — Progress Notes (Signed)
CC: Dr. [name]  Followup note:  [name] [name] returns today approximately one year following completion of electron beam radiation therapy in the management of his T2 N0 squamous cell carcinoma of the skin involving the right temple. He is without complaints today. He saw Dr. Shon Hough this past March and was given a good report. He perform cryotherapy on a few areas of actinic keratoses along the right ear and left face, according to his caregiver.  Physical examination: Alert and oriented. Filed Vitals:   08/30/12 1515  BP: 109/62  Pulse: 80  Temp: 97.3 F (36.3 C)   There is a surgical defect with graft along the right temporal/preauricular region. No visible or palpable evidence for recurrent disease. There is a 1 x 2 cm area of waxy hyperkeratosis just anterior to the R tragus. This is easily peeled off. There is no palpable periaricular, submandibular, or cervical lymphadenopathy  Impression: Satisfactory progress with no evidence for recurrent disease.  Plan: Followup visit with me in 4 months.

## 2012-08-30 NOTE — Progress Notes (Signed)
Patient for routine follow up radiation to right temple.Skin mildly discolored.Denies pain or nausea.Has occasional leg cramps at bedtime.

## 2012-12-27 ENCOUNTER — Ambulatory Visit
Admission: RE | Admit: 2012-12-27 | Discharge: 2012-12-27 | Disposition: A | Discharge status: Home / Self Care | Payer: Medicare Other | Source: Ambulatory Visit | Attending: Radiation Oncology | Admitting: Radiation Oncology

## 2012-12-27 ENCOUNTER — Encounter: Payer: Self-pay | Admitting: Radiation Oncology

## 2012-12-27 VITALS — BP 112/68 | HR 73 | Temp 97.7°F | Resp 20 | Wt 222.7 lb

## 2012-12-27 DIAGNOSIS — C4490 Unspecified malignant neoplasm of skin, unspecified: Secondary | ICD-10-CM

## 2012-12-27 NOTE — Progress Notes (Signed)
CC: Dr. [name]  Followup note:  Mr. Oki returns today approximately one year and 3 months following completion of postoperative electron beam radiation therapy in the management of his T2 N0 squamous cell carcinoma the skin involving the right temple. He is without complaints today. He saw Dr. Shon Hough this past June. He was given a good report.  Physical examination: Alert and oriented. Filed Vitals:   12/27/12 1010  BP: 112/68  Pulse: 73  Temp: 97.7 F (36.5 C)  Resp: 20   There is a surgical defect with graft along his right temporal/preauricular region. No visible or palpable evidence for recurrent disease. There is marked thinning of the graft. There is no palpable periauricular, submandibular or cervical lymphadenopathy.  Impression: Satisfactory progress with no evidence for recurrent disease. The patient inquired about the timing for possible reconstructive surgery, I told that he should wait a minimum of 2 years, and preferably at least 3 years before considering reconstructive surgery with Dr. Shon Hough.  Plan: Followup visit in 4 months.

## 2012-12-27 NOTE — Progress Notes (Signed)
Pt denies pain, fatigue, loss of appetite. Skin of right temple healed.

## 2013-02-03 ENCOUNTER — Telehealth: Payer: Self-pay | Admitting: Dietician

## 2013-04-07 ENCOUNTER — Encounter: Payer: Self-pay | Admitting: *Deleted

## 2013-04-25 ENCOUNTER — Ambulatory Visit
Admission: RE | Admit: 2013-04-25 | Discharge: 2013-04-25 | Disposition: A | Discharge status: Home / Self Care | Payer: Medicare Other | Source: Ambulatory Visit | Attending: Radiation Oncology | Admitting: Radiation Oncology

## 2013-04-25 ENCOUNTER — Encounter: Payer: Self-pay | Admitting: Radiation Oncology

## 2013-04-25 VITALS — BP 95/62 | HR 72 | Temp 97.4°F | Resp 20

## 2013-04-25 DIAGNOSIS — C4490 Unspecified malignant neoplasm of skin, unspecified: Secondary | ICD-10-CM

## 2013-04-25 NOTE — Progress Notes (Signed)
CC: Dr. Cristine Polio  Followup note: Mr. [name] [name] returns today approximately one year and 7 months following completion of postoperative electron beam radiation therapy in the management of his T2 N0 squamous cell carcinoma the skin involving the right temple. He is without complaints today except for some watering of his right eye which has been chronic. This is unchanged. He last saw Dr. Towanda Malkin this past June.  Physical examination: Alert and oriented. Filed Vitals:   04/25/13 0954  BP: 95/62  Pulse: 72  Temp: 97.4 F (36.3 C)  Resp: 20   Head and neck examination: There remains a surgical defect with a graft along his right temporal/preauricular region. No visible or palpable evidence for recurrent disease. There is no palpable periaricular, submandibular or cervical lymphadenopathy. Cranial nerves 2-12 intact.  Depression: Satisfactory progress with no evidence for recurrent disease. He inquires about possible reconstructive surgery, and I again told he should wait at least 2-3 years.  Plan: Followup visit with me in 6 months.

## 2013-04-25 NOTE — Progress Notes (Signed)
Pt denies pain, fatigue, loss of appetite. He does report that his right eye waters.

## 2013-07-10 IMAGING — CR DG ORBITS FOR FOREIGN BODY
3 series · 3 of 3 positions shown · non-contrast
Comparison: None.

CLINICAL DATA: Metallic foreign body for MRI clearance.

ORBITS FOR FOREIGN BODY - 2 VIEW

[w waters (1 of 3)]
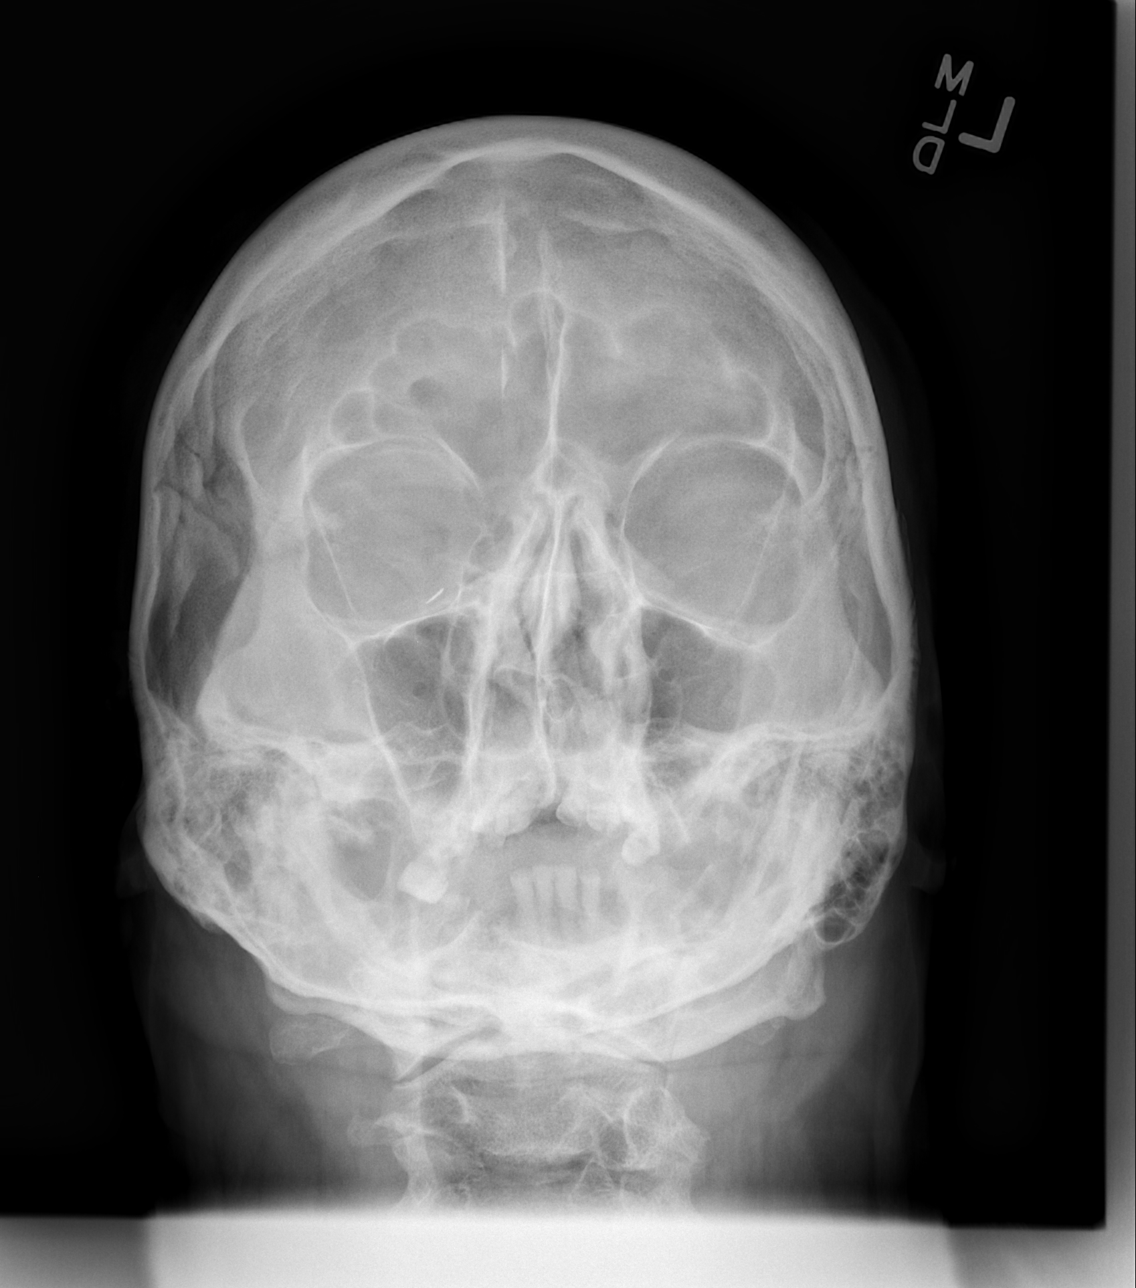

[w waters (2 of 3)]
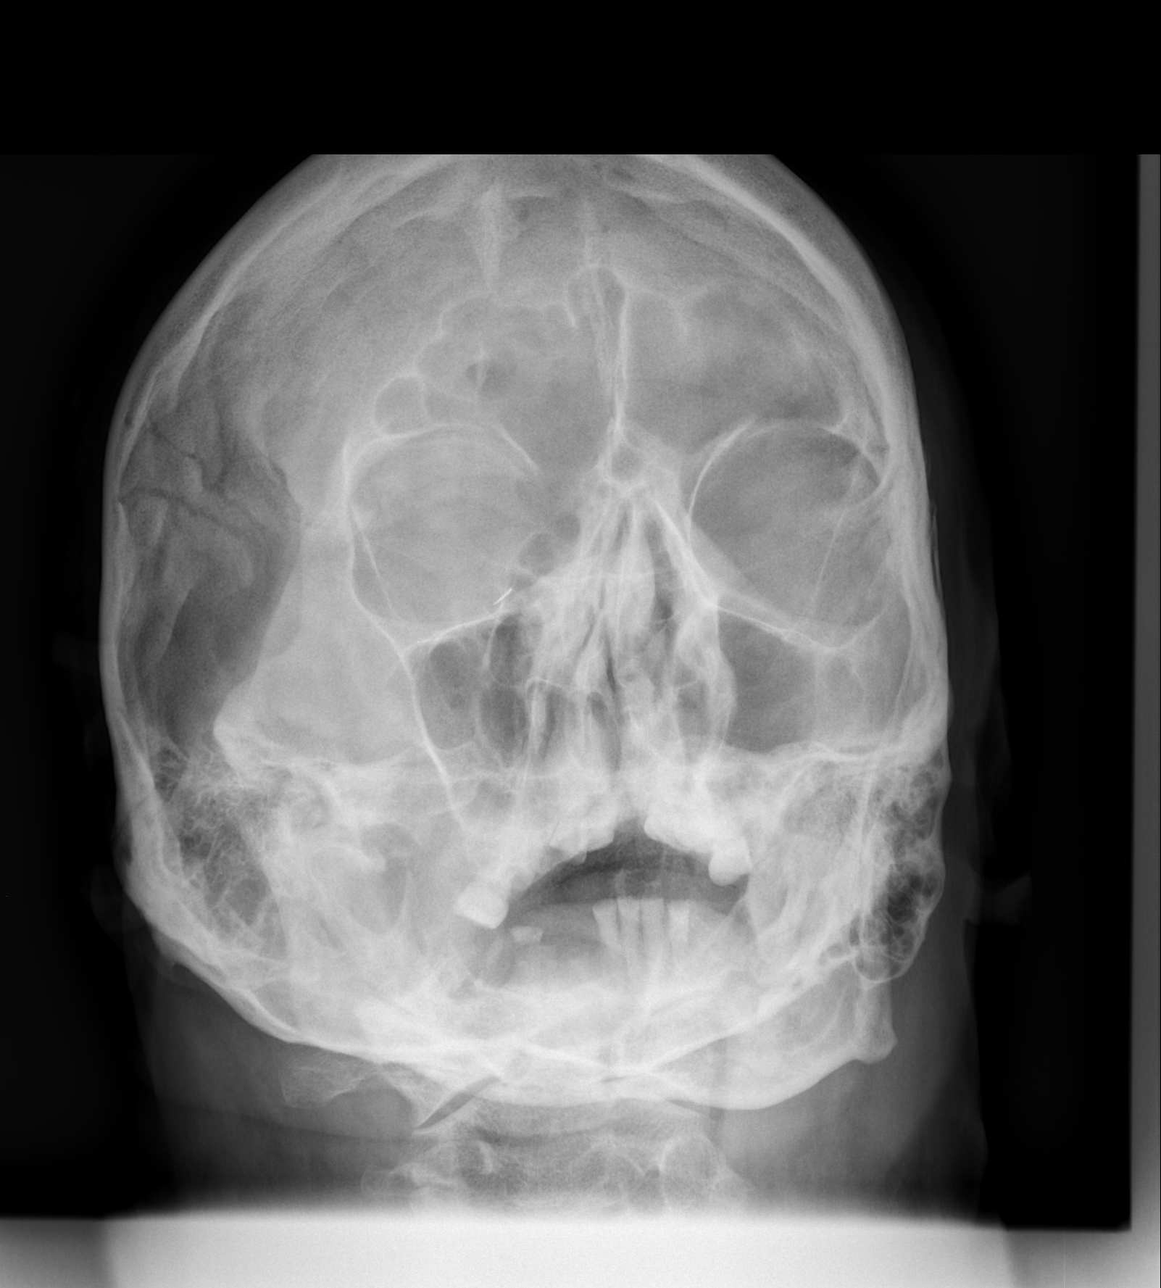

[w waters (3 of 3)]
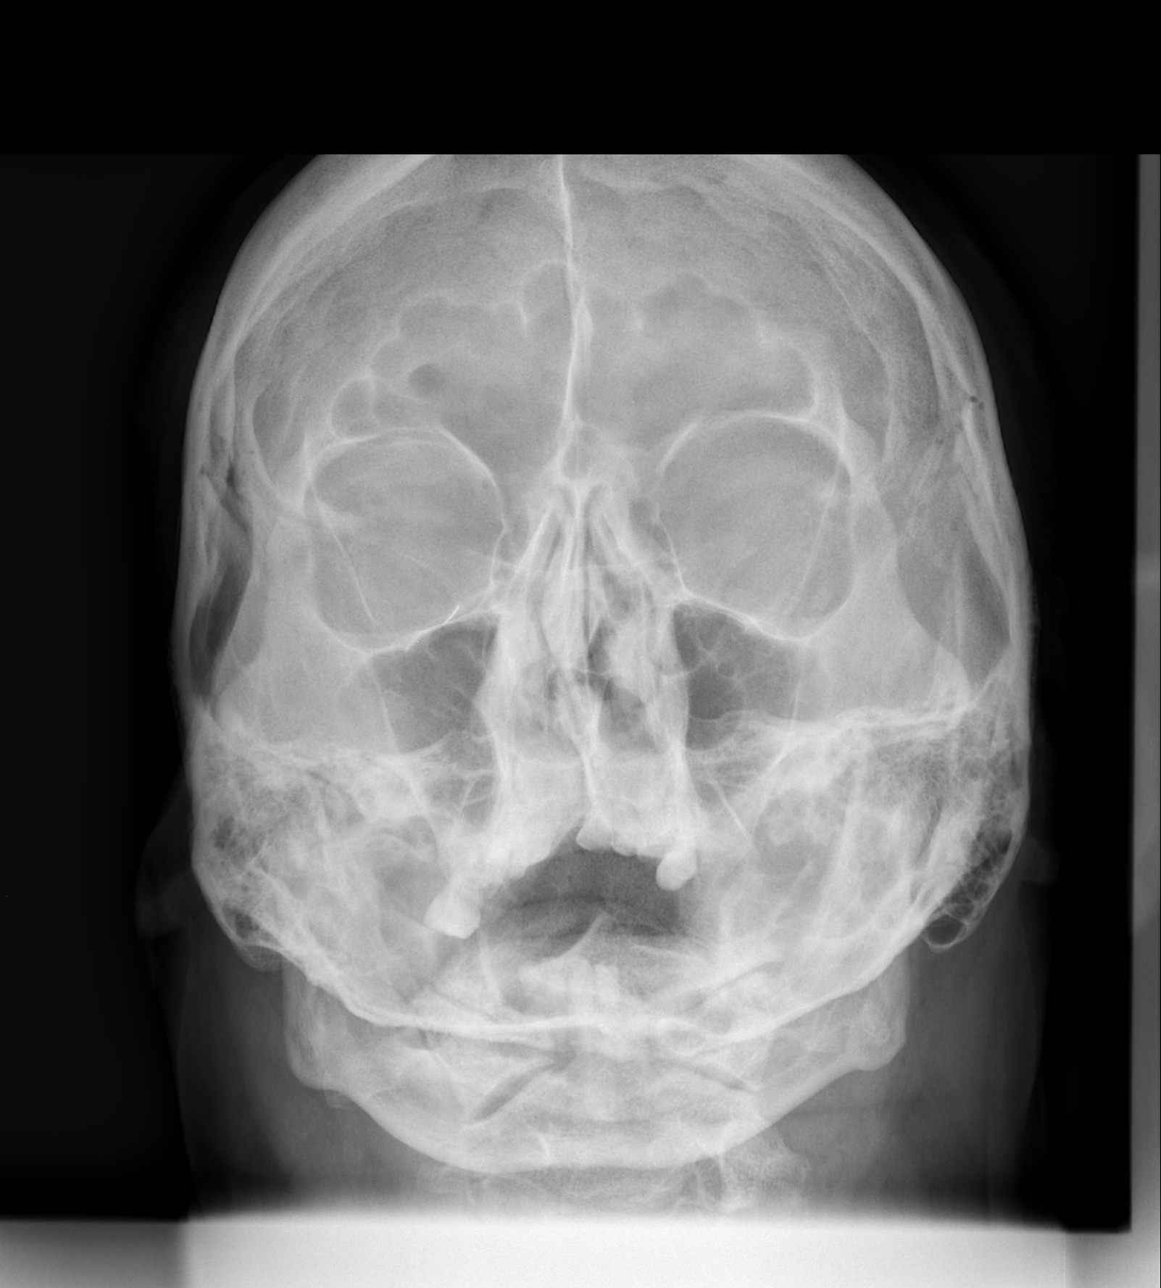

[3 of 3 positions shown; findings below may reference images not displayed]

FINDINGS: Metallic foreign body demonstrated in the medial inferior
aspect of the right orbital region.
IMPRESSION: Metallic foreign body in the medial inferior aspect of the right
orbital region.

## 2013-10-16 ENCOUNTER — Telehealth: Payer: Self-pay | Admitting: Radiation Oncology

## 2013-10-16 NOTE — Telephone Encounter (Signed)
Clld & reminded pt of his appt tomorrow. He was agreeable. (lw)

## 2013-10-17 ENCOUNTER — Ambulatory Visit
Admission: RE | Admit: 2013-10-17 | Discharge: 2013-10-17 | Disposition: A | Discharge status: Home / Self Care | Payer: Medicare Other | Source: Ambulatory Visit | Attending: Radiation Oncology | Admitting: Radiation Oncology

## 2013-10-17 VITALS — BP 108/44 | HR 104 | Temp 97.5°F | Resp 20 | Wt 196.0 lb

## 2013-10-17 DIAGNOSIS — C4490 Unspecified malignant neoplasm of skin, unspecified: Secondary | ICD-10-CM

## 2013-10-17 NOTE — Progress Notes (Signed)
CC: Dr. Cristine Polio  Followup note:  [name] [name] visits today approximately 2 years and 1 month following completion of postoperative electron beam radiation therapy in the management of his T2 N0 squamous cell carcinoma of the skin involving the right temple. He last saw Dr. Towanda Malkin in September. He is without new complaints today. He does have occasional "watering " of his right eye. This is unchanged.  Physical examination: Alert and oriented. Filed Vitals:   10/17/13 1033  BP: 108/44  Pulse: 104  Temp: 97.5 F (36.4 C)  Resp: 20   Nodes: There is no palpable facial, periauricular, submandibular or cervical lymphadenopathy. On inspection the right face there is no visible or palpable evidence for recurrent disease.  Impression: No evidence for recurrent disease. He is still interested in possible reconstructive surgery.  Plan: Followup visit here in 9 months. If he is doing well at that time I will discharge him from my followup.

## 2013-10-17 NOTE — Progress Notes (Signed)
Patient denies pain, issues w/right ear, problems in right temple area where he received radiation, loss of appetite. He does report tearing of right eye. Pt last saw Dr Towanda Malkin Sept 2014.

## 2015-08-30 ENCOUNTER — Other Ambulatory Visit: Payer: Self-pay | Admitting: Internal Medicine

## 2015-08-30 DIAGNOSIS — Z8669 Personal history of other diseases of the nervous system and sense organs: Secondary | ICD-10-CM

## 2015-08-30 DIAGNOSIS — R269 Unspecified abnormalities of gait and mobility: Secondary | ICD-10-CM

## 2015-09-06 ENCOUNTER — Ambulatory Visit
Admission: RE | Admit: 2015-09-06 | Discharge: 2015-09-06 | Disposition: A | Discharge status: Home / Self Care | Payer: Medicare Other | Source: Ambulatory Visit | Attending: Internal Medicine | Admitting: Internal Medicine

## 2015-09-06 DIAGNOSIS — Z8669 Personal history of other diseases of the nervous system and sense organs: Secondary | ICD-10-CM

## 2015-09-06 DIAGNOSIS — R269 Unspecified abnormalities of gait and mobility: Secondary | ICD-10-CM

## 2016-06-23 DIAGNOSIS — N99528 Other complication of other external stoma of urinary tract: Secondary | ICD-10-CM

## 2016-06-23 HISTORY — DX: Other complication of incontinent external stoma of urinary tract: N99.528

## 2016-07-21 ENCOUNTER — Ambulatory Visit: Payer: Medicare Other | Admitting: Oncology

## 2016-07-23 ENCOUNTER — Encounter: Payer: Self-pay | Admitting: *Deleted

## 2016-07-23 ENCOUNTER — Emergency Department
Admission: EM | Admit: 2016-07-23 | Discharge: 2016-07-23 | Disposition: A | Discharge status: Home / Self Care | Payer: Medicare Other | Attending: Emergency Medicine | Admitting: Emergency Medicine

## 2016-07-23 DIAGNOSIS — Z85828 Personal history of other malignant neoplasm of skin: Secondary | ICD-10-CM | POA: Insufficient documentation

## 2016-07-23 DIAGNOSIS — J45909 Unspecified asthma, uncomplicated: Secondary | ICD-10-CM | POA: Diagnosis not present

## 2016-07-23 DIAGNOSIS — Y69 Unspecified misadventure during surgical and medical care: Secondary | ICD-10-CM | POA: Diagnosis not present

## 2016-07-23 DIAGNOSIS — T83092A Other mechanical complication of nephrostomy catheter, initial encounter: Secondary | ICD-10-CM | POA: Diagnosis present

## 2016-07-23 DIAGNOSIS — E1122 Type 2 diabetes mellitus with diabetic chronic kidney disease: Secondary | ICD-10-CM | POA: Insufficient documentation

## 2016-07-23 DIAGNOSIS — N390 Urinary tract infection, site not specified: Secondary | ICD-10-CM | POA: Diagnosis not present

## 2016-07-23 DIAGNOSIS — N183 Chronic kidney disease, stage 3 (moderate): Secondary | ICD-10-CM | POA: Diagnosis not present

## 2016-07-23 DIAGNOSIS — I129 Hypertensive chronic kidney disease with stage 1 through stage 4 chronic kidney disease, or unspecified chronic kidney disease: Secondary | ICD-10-CM | POA: Insufficient documentation

## 2016-07-23 DIAGNOSIS — Z7984 Long term (current) use of oral hypoglycemic drugs: Secondary | ICD-10-CM | POA: Diagnosis not present

## 2016-07-23 DIAGNOSIS — R339 Retention of urine, unspecified: Secondary | ICD-10-CM

## 2016-07-23 DIAGNOSIS — T83098A Other mechanical complication of other indwelling urethral catheter, initial encounter: Secondary | ICD-10-CM

## 2016-07-23 LAB — CBC WITH DIFFERENTIAL/PLATELET
BASOS ABS: 0.1 10*3/uL (ref 0–0.1)
BASOS PCT: 1 %
EOS PCT: 4 %
Eosinophils Absolute: 0.3 10*3/uL (ref 0–0.7)
HCT: 36.6 % — ABNORMAL LOW (ref 40.0–52.0)
HEMOGLOBIN: 12.3 g/dL — AB (ref 13.0–18.0)
Lymphocytes Relative: 12 %
Lymphs Abs: 0.8 10*3/uL — ABNORMAL LOW (ref 1.0–3.6)
MCH: 29.4 pg (ref 26.0–34.0)
MCHC: 33.7 g/dL (ref 32.0–36.0)
MCV: 87.3 fL (ref 80.0–100.0)
Monocytes Absolute: 0.7 10*3/uL (ref 0.2–1.0)
Monocytes Relative: 11 %
NEUTROS PCT: 72 %
Neutro Abs: 5 10*3/uL (ref 1.4–6.5)
PLATELETS: 292 10*3/uL (ref 150–440)
RBC: 4.19 MIL/uL — AB (ref 4.40–5.90)
RDW: 14.7 % — ABNORMAL HIGH (ref 11.5–14.5)
WBC: 6.9 10*3/uL (ref 3.8–10.6)

## 2016-07-23 LAB — URINALYSIS, COMPLETE (UACMP) WITH MICROSCOPIC
BILIRUBIN URINE: NEGATIVE
GLUCOSE, UA: NEGATIVE mg/dL
KETONES UR: NEGATIVE mg/dL
NITRITE: NEGATIVE
PH: 5 (ref 5.0–8.0)
Protein, ur: 100 mg/dL — AB
Specific Gravity, Urine: 1.013 (ref 1.005–1.030)
Squamous Epithelial / LPF: NONE SEEN

## 2016-07-23 LAB — BASIC METABOLIC PANEL
Anion gap: 9 (ref 5–15)
BUN: 38 mg/dL — AB (ref 6–20)
CO2: 26 mmol/L (ref 22–32)
CREATININE: 1.19 mg/dL (ref 0.61–1.24)
Calcium: 9.3 mg/dL (ref 8.9–10.3)
Chloride: 104 mmol/L (ref 101–111)
GFR calc Af Amer: >60 mL/min (ref >60)
Glucose, Bld: 131 mg/dL — ABNORMAL HIGH (ref 65–99)
Potassium: 3.6 mmol/L (ref 3.5–5.1)
SODIUM: 139 mmol/L (ref 135–145)

## 2016-07-23 MED ORDER — CEPHALEXIN 500 MG PO CAPS
500.0000 mg | ORAL_CAPSULE | Freq: Once | ORAL | Status: AC
  Administered 2016-07-23: 500 mg via ORAL
  Filled 2016-07-23: qty 1

## 2016-07-23 MED ORDER — CEPHALEXIN 500 MG PO CAPS: 500.0000 mg | ORAL_CAPSULE | Freq: Three times a day (TID) | ORAL | 0 refills | Status: AC

## 2016-07-23 NOTE — ED Provider Notes (Signed)
[facility] Emergency Department Provider Note  ____________________________________________   First MD Initiated Contact with Patient 07/23/16 1650     (approximate)  I have reviewed the triage vital signs and the nursing notes.   HISTORY  Chief Complaint nephrostomy tubing kinked   HPI [name] [name] is a 71 y.o. male who is presenting to the emergency department with a right nephrostomy obstruction. Per EMS, the tubing has been tangled and after evaluation of the patient's skilled nursing facility it was deemed that the patient needed to come to the emergency department to have the tubing likely replaced.   Past Medical History:  Diagnosis Date  . Arthritis    osteoporosis  . Asthma   . Chronic kidney disease    stage 3  . Depression   . Diabetes mellitus    Type 2 since Oct 2010  . GERD (gastroesophageal reflux disease)   . Hx of radiation therapy 08/04/11 -09/18/11   post-op, right temple 6600 cGy 33 sessions, field reduction after 6000 cGy in 30 sessions  . Hyperlipemia   . Hypertension   . Kidney stones   . 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 Osteoporosis   . Polio    as child-speech slurred  . Seizures ([place])   . 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 Hypertension   . Hyperlipemia   . GERD (gastroesophageal reflux disease)   . Chronic kidney disease   . Depression   . Arthritis   . Hx of radiation therapy   . Skin cancer   . Mental disorder   . Polio   . Kidney stones   . Seizures ([place])   . Osteoporosis     Past Surgical History:  Procedure Laterality Date  . CYSTOSCOPY/RETROGRADE/URETEROSCOPY/STONE EXTRACTION WITH BASKET    . MASS EXCISION  04/06/2011   Procedure: EXCISION MASS;  Surgeon: Hermelinda Dellen;  Location: [facility];  Service: Plastics;  Laterality: N/A;  excision of squamous cell carcinoma right  face with full thickness skin graft from left groin  . TOTAL HIP ARTHROPLASTY      Prior to Admission medications   Medication Sig Start Date End Date Taking? Authorizing Provider  ACCU-CHEK AVIVA PLUS test strip  08/17/11   Historical Provider, MD  busPIRone (BUSPAR) 10 MG tablet Take 10 mg by mouth 4 (four) times daily.    Historical Provider, MD  Choline Fenofibrate (TRILIPIX) 135 MG capsule Take 135 mg by mouth daily.    Historical Provider, MD  doxazosin (CARDURA) 4 MG tablet Take 4 mg by mouth 2 (two) times daily.      Historical Provider, MD  HYDROcodone-acetaminophen (LORTAB) 10-500 MG per tablet Take 1 tablet by mouth every 6 (six) hours as needed.      Historical Provider, MD  Lancets (ACCU-CHEK MULTICLIX) lancets  08/18/11   Historical Provider, MD  metFORMIN (GLUCOPHAGE) 500 MG tablet Take 500 mg by mouth daily with breakfast.      Historical Provider, MD  omeprazole (PRILOSEC) 20 MG capsule Take 20 mg by mouth 2 (two) times daily.      Historical Provider, MD  phenytoin (DILANTIN) 100 MG ER capsule Take 300 mg by mouth 2 (two) times daily.    Historical Provider, MD  simvastatin (ZOCOR) 80 MG tablet Take 80 mg by mouth at bedtime.      Historical Provider,  MD  triamterene-hydrochlorothiazide (MAXZIDE) 75-50 MG per tablet Take 1 tablet by mouth daily.    Historical Provider, MD  valsartan (DIOVAN) 80 MG tablet Take 80 mg by mouth daily.    Historical Provider, MD    Allergies Ace inhibitors  Family History  Problem Relation Age of Onset  . Cancer Mother     melanoma  . Cancer Father     throat  . Cancer Sister     bone  . Cancer Brother     Social History Social History  Substance Use Topics  . Smoking status: Never Smoker  . Smokeless tobacco: Never Used     Comment: exposed to secondhand smoke  . Alcohol use No    Review of Systems Constitutional: No fever/chills Eyes: No visual changes. ENT: No sore throat. Cardiovascular: Denies chest pain. Respiratory:  Denies shortness of breath. Gastrointestinal: No abdominal pain.  No nausea, no vomiting.  No diarrhea.  No constipation. Genitourinary: as above Musculoskeletal: Negative for back pain. Skin: Negative for rash. Neurological: Negative for headaches, focal weakness or numbness.  10-point ROS otherwise negative.  ____________________________________________   PHYSICAL EXAM:  VITAL SIGNS: ED Triage Vitals [07/23/16 1657]  Enc Vitals Group     BP 108/70     Pulse Rate 71     Resp 20     Temp 98 F (36.7 C)     Temp Source Oral     SpO2 98 %     Weight 160 lb (72.6 kg)     Height 6' (1.829 m)     Head Circumference      Peak Flow      Pain Score      Pain Loc      Pain Edu?      Excl. in [place]?     Constitutional: Alert. Well appearing and in no acute distress. Eyes: Conjunctivae are normal. PERRL. EOMI. Head: Atraumatic. Nose: No congestion/rhinnorhea. Mouth/Throat: Mucous membranes are moist.   Neck: No stridor.   Cardiovascular: Normal rate, regular rhythm. Grossly normal heart sounds.   Respiratory: Normal respiratory effort.  No retractions. Lungs CTAB. Gastrointestinal: Soft and nontender. No distention.No CVA tenderness. Genitourinary:    Patient with right-sided nephrostomy tube which is twisted multiple times. Able to untwist multiple times and now urinary flying. About 100 cc of urine flowed out within the first 30 seconds to 1 minutes. Urine was initially cloudy but is clearing. No blood in the urine. After tubing was untwisted the tube was taped in his untwisted state so that flow was maintained.Entry site without any pus, surrounding erythema or induration or tenderness to palpation.  Musculoskeletal: No lower extremity tenderness nor edema.  No joint effusions. Neurologic:   No gross focal neurologic deficits are appreciated.  Skin:  Skin is warm, dry and intact. No rash noted. Psychiatric: Mood and affect are normal. Speech and behavior are  normal.  ____________________________________________   LABS (all labs ordered are listed, but only abnormal results are displayed)  Labs Reviewed  BASIC METABOLIC PANEL - Abnormal; Notable for the following:       Result Value   Glucose, Bld 131 (*)    BUN 38 (*)    All other components within normal limits  CBC WITH DIFFERENTIAL/PLATELET - Abnormal; Notable for the following:    RBC 4.19 (*)    Hemoglobin 12.3 (*)    HCT 36.6 (*)    RDW 14.7 (*)    Lymphs Abs 0.8 (*)    All  other components within normal limits  URINALYSIS, COMPLETE (UACMP) WITH MICROSCOPIC - Abnormal; Notable for the following:    Color, Urine AMBER (*)    APPearance CLOUDY (*)    Hgb urine dipstick MODERATE (*)    Protein, ur 100 (*)    Leukocytes, UA LARGE (*)    Bacteria, UA MANY (*)    All other components within normal limits  URINE CULTURE   ____________________________________________  EKG   ____________________________________________  RADIOLOGY   ____________________________________________   PROCEDURES  Procedure(s) performed:   Procedures  Critical Care performed:   ____________________________________________   INITIAL IMPRESSION / ASSESSMENT AND PLAN / ED COURSE  Pertinent labs & imaging results that were available during my care of the patient were reviewed by me and considered in my medical decision making (see chart for details).  ----------------------------------------- 6:24 PM on 07/23/2016 -----------------------------------------  Patient with urine that is still flowing in the Foley bag. Nontoxic in appearance. White blood cell count is normal. Plan on discharge to home. Caretakers now the bedside. Patient with recent urinalysis with susceptibility to cefazolin. We'll discharge with Keflex because of obstruction with possibility of infection. Family also requesting local urology contact information for follow-up for removal of the nephrostomy.       ____________________________________________   FINAL CLINICAL IMPRESSION(S) / ED DIAGNOSES  Urinary retention.  UTI.      NEW MEDICATIONS STARTED DURING THIS VISIT:  New Prescriptions   No medications on file     Note:  This document was prepared using Dragon voice recognition software and may include unintentional dictation errors.    Orbie Pyo, MD 07/23/16 [id number]

## 2016-07-23 NOTE — ED Notes (Signed)
Pt sent here for his nephrostomy tube being kinked, tube un kinked and dressing applied, pt denies pain and any other concerns

## 2016-07-23 NOTE — ED Notes (Signed)
Called EMS for transport to [facility]

## 2016-07-23 NOTE — ED Triage Notes (Signed)
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

## 2016-07-25 LAB — URINE CULTURE

## 2016-07-28 ENCOUNTER — Inpatient Hospital Stay: Payer: Medicare Other | Attending: Oncology | Admitting: Oncology

## 2016-07-28 ENCOUNTER — Encounter: Payer: Self-pay | Admitting: Oncology

## 2016-07-28 VITALS — BP 105/70 | HR 84 | Temp 97.6°F | Resp 18 | Ht 69.0 in | Wt 157.2 lb

## 2016-07-28 DIAGNOSIS — Z85828 Personal history of other malignant neoplasm of skin: Secondary | ICD-10-CM | POA: Diagnosis not present

## 2016-07-28 DIAGNOSIS — J45909 Unspecified asthma, uncomplicated: Secondary | ICD-10-CM | POA: Insufficient documentation

## 2016-07-28 DIAGNOSIS — Z923 Personal history of irradiation: Secondary | ICD-10-CM | POA: Diagnosis not present

## 2016-07-28 DIAGNOSIS — F329 Major depressive disorder, single episode, unspecified: Secondary | ICD-10-CM | POA: Diagnosis not present

## 2016-07-28 DIAGNOSIS — R569 Unspecified convulsions: Secondary | ICD-10-CM | POA: Diagnosis not present

## 2016-07-28 DIAGNOSIS — N189 Chronic kidney disease, unspecified: Secondary | ICD-10-CM | POA: Insufficient documentation

## 2016-07-28 DIAGNOSIS — E1122 Type 2 diabetes mellitus with diabetic chronic kidney disease: Secondary | ICD-10-CM | POA: Insufficient documentation

## 2016-07-28 DIAGNOSIS — Z87442 Personal history of urinary calculi: Secondary | ICD-10-CM | POA: Diagnosis not present

## 2016-07-28 DIAGNOSIS — C787 Secondary malignant neoplasm of liver and intrahepatic bile duct: Secondary | ICD-10-CM | POA: Insufficient documentation

## 2016-07-28 DIAGNOSIS — Z7984 Long term (current) use of oral hypoglycemic drugs: Secondary | ICD-10-CM | POA: Insufficient documentation

## 2016-07-28 DIAGNOSIS — Z7189 Other specified counseling: Secondary | ICD-10-CM

## 2016-07-28 DIAGNOSIS — Z8612 Personal history of poliomyelitis: Secondary | ICD-10-CM | POA: Insufficient documentation

## 2016-07-28 DIAGNOSIS — E785 Hyperlipidemia, unspecified: Secondary | ICD-10-CM | POA: Insufficient documentation

## 2016-07-28 DIAGNOSIS — M1990 Unspecified osteoarthritis, unspecified site: Secondary | ICD-10-CM | POA: Insufficient documentation

## 2016-07-28 DIAGNOSIS — K219 Gastro-esophageal reflux disease without esophagitis: Secondary | ICD-10-CM | POA: Diagnosis not present

## 2016-07-28 DIAGNOSIS — N183 Chronic kidney disease, stage 3 (moderate): Secondary | ICD-10-CM | POA: Diagnosis not present

## 2016-07-28 DIAGNOSIS — I129 Hypertensive chronic kidney disease with stage 1 through stage 4 chronic kidney disease, or unspecified chronic kidney disease: Secondary | ICD-10-CM | POA: Diagnosis not present

## 2016-07-28 DIAGNOSIS — C189 Malignant neoplasm of colon, unspecified: Secondary | ICD-10-CM | POA: Insufficient documentation

## 2016-07-28 NOTE — Progress Notes (Signed)
07/29/2016 2:33 PM   Cherly Beach 12/07/1945 [id number]  Referring provider: Maryella Shivers, MD [address] [place], [place] [address]  Chief Complaint  Patient presents with  . Hydronephrosis    New Patient  . 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 He underwent attempted ureteral stent placement which was unsuccessful. He ultimately went nephrostomy tube placement. During that admission, he had multiple worrisome findings for stage IV metastatic disease. He's been since been diagnosed with metastatic stage IV colorectal cancer.  After careful consideration, he and his healthcare proxy's have elected not to intervene and would prefer palliation.  He presents to the office to discuss management of his right nephrostomy tube.  He seems to be tolerating the tube well but has not had any signs or symptoms of infections since its placement.  It was placed on 06/19/2016.  His right kidney is atrophic.  At the time of obstruction, his creatinine was elevated to 1.7 from 0.9.  He does have a personal history of kidney stones s/p PCNL in 90s.     PMH: Past Medical History:  Diagnosis Date  . Arthritis    osteoporosis  . Asthma   . Chronic kidney disease    stage 3  . Depression   . Diabetes mellitus    Type 2 since Oct 2010  . GERD (gastroesophageal reflux disease)   . Hx of radiation therapy 08/04/11 -09/18/11   post-op, right temple 6600 cGy 33 sessions, field reduction after 6000 cGy in 30 sessions  . Hyperlipemia   . Hypertension   . Kidney stones   . 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 Nephrostomy tube, retained fragment ([place]) 06/23/2016  . Osteoporosis   . Polio      as child-speech slurred  . Seizures ([place])   . 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 CYSTOSCOPY/RETROGRADE/URETEROSCOPY/STONE EXTRACTION WITH BASKET    . MASS EXCISION  04/06/2011   Procedure: EXCISION MASS;  Surgeon: Hermelinda Dellen;  Location: [facility];  Service: Plastics;  Laterality: N/A;  excision of squamous cell carcinoma right face with full thickness skin graft from left groin  . TOTAL HIP ARTHROPLASTY      Home Medications:  Allergies as of 07/29/2016      Reactions   Ace Inhibitors Cough      Medication List       Accurate as of 07/29/16 11:59 PM. Always use your most recent med list.          ACCU-CHEK AVIVA PLUS test strip Generic drug:  glucose blood   accu-chek multiclix lancets   busPIRone 10 MG tablet Commonly known as:  BUSPAR Take 10 mg by mouth 3 (three) times daily.   cephALEXin 500 MG capsule Commonly known as:  KEFLEX Take 1 capsule (500 mg total) by mouth 3 (three) times daily.   HYDROcodone-acetaminophen 5-325 MG tablet Commonly known as:  NORCO/VICODIN Take 1 tablet by mouth every 6 (six) hours as needed for moderate pain.   metFORMIN 500 MG tablet Commonly known as:  GLUCOPHAGE Take 500 mg by mouth 2 (two)  times daily with a meal.   phenytoin 100 MG ER capsule Commonly known as:  DILANTIN Take 300 mg by mouth 3 (three) times daily.   ranitidine 150 MG tablet Commonly known as:  ZANTAC Take 150 mg by mouth.   triamterene-hydrochlorothiazide 75-50 MG tablet Commonly known as:  MAXZIDE Take 1 tablet by mouth daily.   valsartan 80 MG tablet Commonly known as:  DIOVAN Take 80 mg by mouth daily.       Allergies:  Allergies  Allergen Reactions  . Ace Inhibitors Cough    Family History: Family History  Problem Relation Age of Onset  . Cancer Mother     melanoma  . Cancer Father     throat  . Cancer Sister      bone  . Cancer Brother     Social History:  reports that he has never smoked. He has never used smokeless tobacco. He reports that he does not drink alcohol or use drugs.  ROS: UROLOGY Frequent Urination?: No Hard to postpone urination?: No Burning/pain with urination?: No Get up at night to urinate?: Yes Leakage of urine?: Yes Urine stream starts and stops?: No Trouble starting stream?: No Do you have to strain to urinate?: No Blood in urine?: No Urinary tract infection?: No Sexually transmitted disease?: No Injury to kidneys or bladder?: No Painful intercourse?: No Weak stream?: No Erection problems?: No Penile pain?: No  Gastrointestinal Nausea?: Yes Vomiting?: No Indigestion/heartburn?: Yes Diarrhea?: Yes Constipation?: No  Constitutional Fever: No Night sweats?: No Weight loss?: Yes Fatigue?: Yes  Skin Skin rash/lesions?: No Itching?: No  Eyes Blurred vision?: No Double vision?: No  Ears/Nose/Throat Sore throat?: No Sinus problems?: No  Hematologic/Lymphatic Swollen glands?: No Easy bruising?: No  Cardiovascular Leg swelling?: Yes Chest pain?: No  Respiratory Cough?: No Shortness of breath?: No  Endocrine Excessive thirst?: No  Musculoskeletal Back pain?: Yes Joint pain?: No  Neurological Headaches?: Yes Dizziness?: No  Psychologic Depression?: Yes Anxiety?: Yes  Physical Exam: BP 113/78   Pulse 86   Ht 5\' 10"  (1.778 m)   Wt 157 lb (71.2 kg)   BMI 22.53 kg/m   Constitutional:  Alert and oriented, No acute distress.  Answers some questions with short answers. In a wheelchair. Accompanied today by her caretaker (POA's sister) and healthcare power of attorney on speaker phone. HEENT: [place] AT, moist mucus membranes.  Trachea midline, no masses. Cardiovascular: No clubbing, cyanosis, or edema. Respiratory: Normal respiratory effort, no increased work of breathing. GI: Abdomen is soft, nontender, nondistended, no abdominal  masses GU: Right nephrostomy tube in place, site clean dry and intact. Draining slightly cloudy urine. Skin: No rashes, bruises or suspicious lesions. Neurologic: Grossly intact, no focal deficits, moving all 4 extremities. Psychiatric: Normal mood and affect.  Laboratory Data: Lab Results  Component Value Date   WBC 6.9 07/23/2016   HGB 12.3 (L) 07/23/2016   HCT 36.6 (L) 07/23/2016   MCV 87.3 07/23/2016   PLT 292 07/23/2016    Lab Results  Component Value Date   CREATININE 1.19 07/23/2016   Urinalysis Results for orders placed or performed in visit on 07/29/16  CULTURE, URINE COMPREHENSIVE  Result Value Ref Range   Urine Culture, Comprehensive Final report (A)    Result 1 Yeast isolated. (A)     Pertinent Imaging: CLINICAL DATA:  RIGHT ureteral calculus, nephrostomy tube, hypertension, diabetes mellitus  EXAM: ABDOMEN - 1 VIEW  COMPARISON:  None  FINDINGS: Pigtail RIGHT thoracostomy tube noted.  Questionable calcification 6  mm diameter versus bowel artifact projecting over the RIGHT kidney.  Large nonspecific calcification projects over RIGHT iliac wing.  Bones demineralized with multilevel degenerative disc disease changes of the lumbar spine.  RIGHT hip prosthesis noted.  Increased stool in colon with scattered gas within large and small bowel loops.  IMPRESSION: No acute abnormalities.  Increased stool in colon.   Electronically Signed   By: Lavonia Dana M.D.   On: 07/29/2016 17:32  KUB was personally reviewed today. Previous CT scan performed at [facility], report reviewed but unable to personally review images.  Assessment & Plan:   71 year old male with an obstructing 14 mm ureteral calculus status post nephrostomy tube placement.  Lengthy discussion today with the patient's legal guardian, Arnette Norris as well as her sister who is present today.  In the setting of metastatic untreated colon cancer, lives overall  prognosis is quite poor. In order to make him more comfortable, they would prefer to get the nephrostomy tube out. I explained that if we were to remove the nephrostomy tube without draining the right kidney, he would likely become septic again and possibly succumb to this illness. Alternatively, options including shockwave lithotripsy for treatment of the ureteral stone versus ureteroscopy with serial removal of the tube were discussed in order to unobstruct the right collecting system.  Alternatives including serial nephrostomy tube changes were also discussed. Additionally, right nephrectomy could be considered if the kidney is truly atrophic (unable to review imaging personally today) however they would not want him to have an invasive procedure.  At the end of our discussion, they're most interested in proceeding with shockwave lithotripsy as if he is deemed a good candidate as this is noninvasive and would potentially allow [place] to remove the nephrostomy tube less risk. They'll bring the CT images by early next week for me to assess his anatomy, stone to skin distance, and Hounsfield units to make sure that he is a good candidate for this. Stone can be seen on KUB today.   1. Right ureteral stone After lengthy discussion, he will likely undergo right ESWL. Risk and benefits were reviewed including risk of bleeding, infection, damage stranding structures, failure of the procedure, amongst others. All questions were answered. Paperwork was filled out.  We discussed that if this procedure fails, we will likely proceed with serial nephrostomy tube exchanges.  - Abdomen 1 view (KUB) - CULTURE, URINE COMPREHENSIVE  2. History of sepsis Urine culture from nephrostomy tube obtained today for perioperative antibiotics.  3. Atrophic kidney History of right atrophic kidney   Hollice Espy, MD  [place] [address] [place], [place] [address] [phone]   I spent 25 min with this patient of which greater than 50% was spent in counseling and coordination of care with the patient.

## 2016-07-28 NOTE — Progress Notes (Signed)
Pt is here as new pt evaluation. Pt has developmental d/o ( IQ unkown but age 71 level per care givers )  Pt has had nephrostomy tube in [facility] hosp ) approx 5 w ago. Medication list not complete (this Probation officer will reconcile -calling [facility] where pt has been living x 5 w. POA-Sherry Adams-[phone].  Add-[phone]  [facility] faxed over med list and the info entered was via this list and POA Sherry-since new med Keflex not on the faxed list. This info was per Judeen Hammans POA-stated Keflex started at [place] [facility]

## 2016-07-28 NOTE — Progress Notes (Signed)
Hematology/Oncology Consult note 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 He presented to [facility] in mid March 2018 with symptoms of nausea vomiting and abdominal pain. He was found to have sepsis secondary to pyelonephritis from 14 limited to obstructive stones and proctocolitis secondary to fecal impaction. He was treated with IV antibiotics. He was also found to have a right hydronephrosis and had a nephrostomy tube placed on the right side   2.  As a part of the workup patient had CT abdomen and pelvis without contrast which showed:   1.Large ill defined mass in the right hepatic lobe is concerning for primary liver neoplasm versus metastatic disease. Recommend correlation with history of malignancy. 2.Large rectal and sigmoid colonic stool burden with findings concerning for stercoral proctocolitis. Recommend disimpaction. 3.Mild pericolic stranding involving the descending colon is concerning for infectious/inflammatory colitis. 4.Left greater than right perinephric stranding with circumferential bladder wall thickening. Findings could reflect pyelonephritis and ascending urinary tract infection in the appropriate clinical setting. Recommend correlation with urinalysis. 5.Atrophic right kidney with moderate right hydronephrosis. 14 mm obstructive calculus in the mid right ureter. This finding is favored to be chronic.  3. This was followed by an MRI of the  abdomen which showed:Marland Kitchen Solitary mass centered in the posterior right hepatic lobe measures nearly 10 cm in maximum dimension. 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 Oligometastatic disease from an unknown primary is an additional consideration. 2.Findings compatible with multifocal left-sided pyelonephritis.  3.Right-sided hydronephrosis and hydroureter have resolved after placement of a percutaneous nephrostomy tube. Locules of gas within the right renal collecting system are not unexpected.   4. CT chest with contrast did not show any evidence of metastatic disease   5. 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  Specimen Adequacy:Satisfactory for evaluation. COMMENT:Immunoperoxidase studies on the core biopsy material show the neoplastic cells are positive for cytokeratin 20 and CDX2, with negative staining for cytokeratin 7 and TTF-1.  5. Patient currently lives in [place] care facility and does not have any living family member. His healthcare proxy is Clydia Llano and her sister Leslee Home who have been caring for him after their mother passed away. Manuela Schwartz tells me that patient is in general fearful of seeing doctors and going to hospital. It has been difficult for him to adjust at his nursing facility where he has been living for about a month. He does not verbalize much even when he has pain or discomfort. He is pleasant today and denies any pain or discomfort. He can only ambulate about 150 ft due to balance issues   ECOG PS- 3  Pain scale- 0   Review of systems- Review of Systems  Unable to perform ROS: Mental acuity    Allergies  Allergen Reactions  . Ace Inhibitors Cough    Patient Active Problem List   Diagnosis Date Noted  .  Hypertension   . Hyperlipemia   . GERD (gastroesophageal reflux disease)   . Chronic kidney disease   . Depression   . Arthritis   . Hx of radiation therapy   . Skin cancer   . Mental disorder   . Polio   . Kidney stones   . Seizures ([place])   . Osteoporosis      Past Medical History:  Diagnosis Date  . Arthritis    osteoporosis  . Asthma   . Chronic kidney disease    stage 3  . Depression   . Diabetes mellitus    Type 2 since Oct 2010  . GERD (gastroesophageal reflux disease)   . Hx of radiation therapy 08/04/11 -09/18/11   post-op, right temple 6600 cGy 33 sessions, field reduction after 6000 cGy in 30 sessions  . Hyperlipemia   . Hypertension   . Kidney stones   . 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 Osteoporosis   . Polio    as child-speech slurred  . Seizures ([place])   . 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 CYSTOSCOPY/RETROGRADE/URETEROSCOPY/STONE EXTRACTION WITH BASKET    . MASS EXCISION  04/06/2011   Procedure: EXCISION MASS;  Surgeon: Hermelinda Dellen;  Location: [place];  Service: Plastics;  Laterality: N/A;  excision of squamous cell carcinoma right face with full thickness skin graft from left groin  . TOTAL HIP ARTHROPLASTY      Social History   Social History  . Marital status: Single    Spouse name: N/A  . Number of children: N/A  . Years of education: N/A   Occupational History  . Not on file.   Social History Main Topics  . Smoking status: Never Smoker  . Smokeless tobacco: Never Used     Comment: exposed to secondhand smoke  . Alcohol use No  . Drug use: No  . Sexual activity: Not on file   Other Topics Concern  . Not on file   Social History Narrative  . No narrative on file     Family History  Problem Relation Age of Onset  . Cancer Mother     melanoma  . Cancer Father     throat  . Cancer Sister     bone  .  Cancer Brother      Current Outpatient Prescriptions:  .  ACCU-CHEK AVIVA PLUS test strip, , Disp: , Rfl:  .  busPIRone (BUSPAR) 10 MG tablet, Take 10 mg by mouth 3 (three) times daily. , Disp: , Rfl:  .  cephALEXin (KEFLEX) 500 MG capsule, Take 1 capsule (500 mg total) by mouth 3 (three) times daily., Disp: 21 capsule, Rfl: 0 .  HYDROcodone-acetaminophen (NORCO/VICODIN) 5-325 MG tablet, Take 1 tablet by mouth every 6 (six) hours as needed for moderate pain., Disp: , Rfl:  .  Lancets (ACCU-CHEK MULTICLIX) lancets, , Disp: , Rfl:  .  metFORMIN (GLUCOPHAGE) 500 MG tablet, Take 500 mg by mouth 2 (two) times daily with a meal. , Disp: , Rfl:  .  phenytoin (DILANTIN) 100 MG ER capsule, Take 300 mg by mouth 3 (three) times daily. , Disp: , Rfl:  .  ranitidine (ZANTAC) 150 MG tablet, Take 150 mg by mouth., Disp: , Rfl:  .  triamterene-hydrochlorothiazide (MAXZIDE) 75-50 MG per tablet, Take 1 tablet by mouth daily., Disp: , Rfl:  .  Choline Fenofibrate (  TRILIPIX) 135 MG capsule, Take 135 mg by mouth daily., Disp: , Rfl:  .  doxazosin (CARDURA) 4 MG tablet, Take 4 mg by mouth 2 (two) times daily.  , Disp: , Rfl:  .  sertraline (ZOLOFT) 50 MG tablet, Take 50 mg by mouth., Disp: , Rfl:  .  simvastatin (ZOCOR) 80 MG tablet, Take 80 mg by mouth at bedtime.  , Disp: , Rfl:  .  valsartan (DIOVAN) 80 MG tablet, Take 80 mg by mouth daily., Disp: , Rfl:    Physical exam:  Vitals:   07/28/16 1402  BP: 105/70  Pulse: 84  Resp: 18  Temp: 97.6 F (36.4 C)  TempSrc: Tympanic  Weight: 157 lb 3.2 oz (71.3 kg)  Height: 5\' 9"  (1.753 m)   Physical Exam  Constitutional: He is well-developed, well-nourished, and in no distress.  He is sitting in a wheelchair  HENT:  Head: Normocephalic and atraumatic.  Scar from previous SCC skin seen over right lateral forehead  Eyes: EOM are normal. Pupils are equal, round, and reactive to light.  Neck: Normal range of motion.  Cardiovascular: Normal rate, regular  rhythm and normal heart sounds.   Pulmonary/Chest: Effort normal and breath sounds normal.  Abdominal: Soft. Bowel sounds are normal.  Exam limited as patient is in a wheelchair and cannot ambulate much. Right nephrostomy tube in place  Neurological: He is alert.  Skin: Skin is warm and dry.       CMP Latest Ref Rng & Units 07/23/2016  Glucose 65 - 99 mg/dL 131(H)  BUN 6 - 20 mg/dL 38(H)  Creatinine 0.61 - 1.24 mg/dL 1.19  Sodium 135 - 145 mmol/L 139  Potassium 3.5 - 5.1 mmol/L 3.6  Chloride 101 - 111 mmol/L 104  CO2 22 - 32 mmol/L 26  Calcium 8.9 - 10.3 mg/dL 9.3   CBC Latest Ref Rng & Units 07/23/2016  WBC 3.8 - 10.6 K/uL 6.9  Hemoglobin 13.0 - 18.0 g/dL 12.3(L)  Hematocrit 40.0 - 52.0 % 36.6(L)  Platelets 150 - 440 K/uL 292     Assessment and plan- Patient is a 71 y.o. male found to have large hepatic mass likely metastatic colon cancer  I discussed the results of the CT scans, MRI and pathology with the patient's health care providers today. Based on immunohistochemistry this appears to be metastatic colon cancer. Patient has never had a colonoscopy in the past. He does have stage IV cancer based on diagnosis and treatment is considered only be palliative and not curative. Even with aggressive chemotherapy median survival for stage 4 colon cancer is about 30 months. Without treatment his prognosis is likely less than 6 months. In an ideal case scenario we would have recommended getting colonoscopy for further workup and refer him to [facility] or [facility] to see if the liver lesion would be resectable after some neoadjuvant chemotherapy followed by adjuvant chemotherapy. If the liver lesion was not resectable we would be looking at offering palliative chemotherapy given IV every 2 weeks until progression or toxicity. With IV chemotherapy is not tolerated then oral chemotherapy pills such as Xeloda would be a consideration. TREATMENTS would be associated with side effects including all but not  limited to nausea, fatigue, low blood counts, risk of infections as well as frequent doctor visits and blood draws. Patient does not have decision making capacity. Darrelyn Hillock has kept patients best interests in mind and do not feel that he would benefit from further work up such as colonoscopy and Aggressive  treatment like chemotherapy. They would like to focus on his comfort and quality of life. I think that is reasonable keeping in mind patients performance status and desire to avoid medical facilities as well as limited ability to communicate. I will theerfore make arrangements for hospice referral at this point.  Patient can see me in the future if questions or concerns arise   Thank you for this kind referral and the opportunity to participate in the care of this patient   Visit Diagnosis 1. Metastatic colon cancer to liver ([place])   2. Goals of care, counseling/discussion     Dr. Randa Evens, MD, MPH [facility] at [facility] Pager- [phone] 07/28/2016 3:57 PM

## 2016-07-28 NOTE — Progress Notes (Signed)
  Oncology Nurse Navigator Documentation Met with Mr. Beazer and his POA's during consult with Dr. Janese Banks. Introduced Therapist, nutritional and provided contact information for any future needs or support. Hospice referral being placed per Dr. Janese Banks. Navigator Location: [facility] (07/28/16 1500)   )Navigator Encounter Type: Initial MedOnc (07/28/16 1500)                     Patient Visit Type: MedOnc;Initial (07/28/16 1500) Treatment Phase: Pre-Tx/Tx Discussion (07/28/16 1500) Barriers/Navigation Needs: Family concerns (07/28/16 1500)                Acuity: Level 2 (07/28/16 1500)   Acuity Level 2: Initial guidance, education and coordination as needed;Ongoing guidance and education throughout treatment as needed (07/28/16 1500)     Time Spent with Patient: 30 (07/28/16 1500)

## 2016-07-29 ENCOUNTER — Ambulatory Visit
Admission: RE | Admit: 2016-07-29 | Discharge: 2016-07-29 | Disposition: A | Discharge status: Home / Self Care | Payer: No Typology Code available for payment source | Source: Ambulatory Visit | Attending: Urology | Admitting: Urology

## 2016-07-29 ENCOUNTER — Ambulatory Visit (INDEPENDENT_AMBULATORY_CARE_PROVIDER_SITE_OTHER): Payer: Medicare Other | Admitting: Urology

## 2016-07-29 ENCOUNTER — Telehealth: Payer: Self-pay | Admitting: *Deleted

## 2016-07-29 ENCOUNTER — Encounter: Payer: Self-pay | Admitting: Urology

## 2016-07-29 VITALS — BP 113/78 | HR 86 | Ht 70.0 in | Wt 157.0 lb

## 2016-07-29 DIAGNOSIS — N201 Calculus of ureter: Secondary | ICD-10-CM | POA: Diagnosis not present

## 2016-07-29 DIAGNOSIS — N261 Atrophy of kidney (terminal): Secondary | ICD-10-CM

## 2016-07-29 DIAGNOSIS — Z8619 Personal history of other infectious and parasitic diseases: Secondary | ICD-10-CM

## 2016-07-29 NOTE — Telephone Encounter (Signed)
Called [name] [name] because [facility] did not send me a POA yest. She states that her sister has pt. Over at another md appt and she has the POA because they would not see pt without it.  I looked up in media and [facility] scanned it in so I now do not need a copy.  I told her I will fax the referral in and then hospice will get permission to come into [facility] and have hospice services.  I have written on the top of ref. That [name] [name] and her sister would like to be at [facility] when hospice comes in to do admitting assessment. I faxed over the papers today to hospice

## 2016-07-30 ENCOUNTER — Other Ambulatory Visit: Payer: Self-pay | Admitting: Radiology

## 2016-07-30 ENCOUNTER — Telehealth: Payer: Self-pay | Admitting: Radiology

## 2016-07-30 DIAGNOSIS — N201 Calculus of ureter: Secondary | ICD-10-CM

## 2016-07-30 NOTE — Telephone Encounter (Signed)
Notified Pamala Hurry, pt's nurse at [facility], of ESWL scheduled 08/06/16 @6 :30. Faxed instructions to [place] went over them, answering any questions. Also notified Pamala Hurry of KUB needed prior to f/u appt with Dr Erlene Quan which is scheduled on 08/28/16 @10 :00. Pamala Hurry voices understanding & will call back if any conflicts arise.

## 2016-08-01 LAB — CULTURE, URINE COMPREHENSIVE

## 2016-08-03 ENCOUNTER — Telehealth: Payer: Self-pay | Admitting: Radiology

## 2016-08-03 ENCOUNTER — Other Ambulatory Visit: Payer: Self-pay

## 2016-08-03 DIAGNOSIS — B3741 Candidal cystitis and urethritis: Secondary | ICD-10-CM

## 2016-08-03 MED ORDER — FLUCONAZOLE 100 MG PO TABS: 100.0000 mg | ORAL_TABLET | Freq: Every day | ORAL | 0 refills | Status: DC

## 2016-08-03 NOTE — Telephone Encounter (Signed)
Received call from [facility] stating she has not received the CD of pt's CT scan from [facility]. Ms. Huel Cote states the pt became very agitated while getting his KUB recently at [facility]. She is concerned because his nephrostomy tube has been in place for 6 weeks and she is anxious to get it changed.  Advised Ms. Beckner that per Dr Erlene Quan, this will be addressed after pt has ESWL which is scheduled for 08/06/16. Ms. Huel Cote voices understanding.

## 2016-08-03 NOTE — Telephone Encounter (Signed)
Verbal order given to Levada Dy, pt's nurse at [facility], for Diflucan 100mg  po daily x 7 days beginning today. Levada Dy voices understanding.

## 2016-08-03 NOTE — Telephone Encounter (Signed)
-----   Message from Hollice Espy, MD sent at 08/01/2016  2:05 PM EDT ----- I would like to treat this patient with Diflucan 100 mg by mouth daily starting 3 days prior to the procedure and completing a 7 day course.  Hollice Espy, MD

## 2016-08-06 ENCOUNTER — Observation Stay: Payer: Medicare Other

## 2016-08-06 ENCOUNTER — Observation Stay
Admission: RE | Admit: 2016-08-06 | Discharge: 2016-08-07 | Disposition: A | Discharge status: Home / Self Care | Payer: Medicare Other | Source: Ambulatory Visit | Attending: Urology | Admitting: Urology

## 2016-08-06 ENCOUNTER — Other Ambulatory Visit: Payer: Self-pay | Admitting: *Deleted

## 2016-08-06 ENCOUNTER — Encounter
Admission: RE | Disposition: A | Discharge status: Home / Self Care | Payer: Self-pay | Source: Ambulatory Visit | Attending: Urology

## 2016-08-06 ENCOUNTER — Encounter: Payer: Self-pay | Admitting: *Deleted

## 2016-08-06 DIAGNOSIS — J45909 Unspecified asthma, uncomplicated: Secondary | ICD-10-CM | POA: Diagnosis not present

## 2016-08-06 DIAGNOSIS — Z87442 Personal history of urinary calculi: Secondary | ICD-10-CM | POA: Insufficient documentation

## 2016-08-06 DIAGNOSIS — E785 Hyperlipidemia, unspecified: Secondary | ICD-10-CM | POA: Insufficient documentation

## 2016-08-06 DIAGNOSIS — Z96649 Presence of unspecified artificial hip joint: Secondary | ICD-10-CM | POA: Insufficient documentation

## 2016-08-06 DIAGNOSIS — Z808 Family history of malignant neoplasm of other organs or systems: Secondary | ICD-10-CM | POA: Insufficient documentation

## 2016-08-06 DIAGNOSIS — Z923 Personal history of irradiation: Secondary | ICD-10-CM | POA: Diagnosis not present

## 2016-08-06 DIAGNOSIS — Z85828 Personal history of other malignant neoplasm of skin: Secondary | ICD-10-CM | POA: Insufficient documentation

## 2016-08-06 DIAGNOSIS — M1990 Unspecified osteoarthritis, unspecified site: Secondary | ICD-10-CM | POA: Insufficient documentation

## 2016-08-06 DIAGNOSIS — F329 Major depressive disorder, single episode, unspecified: Secondary | ICD-10-CM | POA: Insufficient documentation

## 2016-08-06 DIAGNOSIS — R5383 Other fatigue: Secondary | ICD-10-CM

## 2016-08-06 DIAGNOSIS — Z8612 Personal history of poliomyelitis: Secondary | ICD-10-CM | POA: Insufficient documentation

## 2016-08-06 DIAGNOSIS — K219 Gastro-esophageal reflux disease without esophagitis: Secondary | ICD-10-CM | POA: Insufficient documentation

## 2016-08-06 DIAGNOSIS — E1122 Type 2 diabetes mellitus with diabetic chronic kidney disease: Secondary | ICD-10-CM | POA: Diagnosis not present

## 2016-08-06 DIAGNOSIS — N183 Chronic kidney disease, stage 3 (moderate): Secondary | ICD-10-CM | POA: Diagnosis not present

## 2016-08-06 DIAGNOSIS — Z936 Other artificial openings of urinary tract status: Secondary | ICD-10-CM | POA: Diagnosis not present

## 2016-08-06 DIAGNOSIS — Z800 Family history of malignant neoplasm of digestive organs: Secondary | ICD-10-CM | POA: Diagnosis not present

## 2016-08-06 DIAGNOSIS — Z7984 Long term (current) use of oral hypoglycemic drugs: Secondary | ICD-10-CM | POA: Diagnosis not present

## 2016-08-06 DIAGNOSIS — R569 Unspecified convulsions: Secondary | ICD-10-CM | POA: Insufficient documentation

## 2016-08-06 DIAGNOSIS — C19 Malignant neoplasm of rectosigmoid junction: Secondary | ICD-10-CM | POA: Insufficient documentation

## 2016-08-06 DIAGNOSIS — M810 Age-related osteoporosis without current pathological fracture: Secondary | ICD-10-CM | POA: Insufficient documentation

## 2016-08-06 DIAGNOSIS — I129 Hypertensive chronic kidney disease with stage 1 through stage 4 chronic kidney disease, or unspecified chronic kidney disease: Secondary | ICD-10-CM | POA: Insufficient documentation

## 2016-08-06 DIAGNOSIS — N132 Hydronephrosis with renal and ureteral calculous obstruction: Principal | ICD-10-CM | POA: Insufficient documentation

## 2016-08-06 DIAGNOSIS — C799 Secondary malignant neoplasm of unspecified site: Secondary | ICD-10-CM | POA: Diagnosis not present

## 2016-08-06 DIAGNOSIS — N201 Calculus of ureter: Secondary | ICD-10-CM | POA: Diagnosis present

## 2016-08-06 HISTORY — PX: EXTRACORPOREAL SHOCK WAVE LITHOTRIPSY: SHX1557

## 2016-08-06 HISTORY — PX: IR NEPHROSTOMY TUBE CHANGE: IMG1442

## 2016-08-06 LAB — MRSA PCR SCREENING: MRSA by PCR: NEGATIVE

## 2016-08-06 LAB — GLUCOSE, CAPILLARY
GLUCOSE-CAPILLARY: 68 mg/dL (ref 65–99)
GLUCOSE-CAPILLARY: 156 mg/dL — AB (ref 65–99)
Glucose-Capillary: 80 mg/dL (ref 65–99)

## 2016-08-06 SURGERY — LITHOTRIPSY, ESWL
Anesthesia: Moderate Sedation | Laterality: Right

## 2016-08-06 MED ORDER — FLUCONAZOLE 100 MG PO TABS
100.0000 mg | ORAL_TABLET | Freq: Every day | ORAL | Status: DC
  Administered 2016-08-06 – 2016-08-07 (×2): 100 mg via ORAL
  Filled 2016-08-06 (×2): qty 1

## 2016-08-06 MED ORDER — BUSPIRONE HCL 10 MG PO TABS
10.0000 mg | ORAL_TABLET | Freq: Three times a day (TID) | ORAL | Status: DC
  Administered 2016-08-06 – 2016-08-07 (×3): 10 mg via ORAL
  Filled 2016-08-06 (×4): qty 1

## 2016-08-06 MED ORDER — OXYBUTYNIN CHLORIDE 5 MG PO TABS
5.0000 mg | ORAL_TABLET | Freq: Three times a day (TID) | ORAL | Status: DC | PRN
  Filled 2016-08-06: qty 1

## 2016-08-06 MED ORDER — BELLADONNA ALKALOIDS-OPIUM 16.2-60 MG RE SUPP: 1.0000 | Freq: Four times a day (QID) | RECTAL | Status: DC | PRN

## 2016-08-06 MED ORDER — DEXTROSE-NACL 5-0.45 % IV SOLN
INTRAVENOUS | Status: DC
  Administered 2016-08-06: 07:00:00 via INTRAVENOUS

## 2016-08-06 MED ORDER — DEXTROSE 5 % IV SOLN
1.0000 g | Freq: Every day | INTRAVENOUS | Status: DC
  Administered 2016-08-06: 1 g via INTRAVENOUS
  Filled 2016-08-06 (×2): qty 10

## 2016-08-06 MED ORDER — FLUMAZENIL 0.5 MG/5ML IV SOLN
0.2000 mg | Freq: Once | INTRAVENOUS | Status: AC
  Administered 2016-08-06: 0.2 mg via INTRAVENOUS

## 2016-08-06 MED ORDER — IOPAMIDOL (ISOVUE-300) INJECTION 61%
30.0000 mL | Freq: Once | INTRAVENOUS | Status: AC | PRN
  Administered 2016-08-06: 10 mL via URETHRAL

## 2016-08-06 MED ORDER — FAMOTIDINE 20 MG PO TABS
20.0000 mg | ORAL_TABLET | Freq: Two times a day (BID) | ORAL | Status: DC
  Administered 2016-08-06 – 2016-08-07 (×2): 20 mg via ORAL
  Filled 2016-08-06 (×2): qty 1

## 2016-08-06 MED ORDER — DIPHENHYDRAMINE HCL 25 MG PO CAPS
25.0000 mg | ORAL_CAPSULE | ORAL | Status: AC
  Administered 2016-08-06: 25 mg via ORAL

## 2016-08-06 MED ORDER — DEXTROSE-NACL 5-0.45 % IV SOLN
INTRAVENOUS | Status: DC
  Administered 2016-08-06: 19:00:00 via INTRAVENOUS

## 2016-08-06 MED ORDER — FLUMAZENIL 0.5 MG/5ML IV SOLN
INTRAVENOUS | Status: AC
  Administered 2016-08-06: 0.2 mg via INTRAVENOUS
  Filled 2016-08-06: qty 5

## 2016-08-06 MED ORDER — PHENYTOIN SODIUM EXTENDED 100 MG PO CAPS
100.0000 mg | ORAL_CAPSULE | Freq: Three times a day (TID) | ORAL | Status: DC
  Administered 2016-08-06 – 2016-08-07 (×3): 100 mg via ORAL
  Filled 2016-08-06 (×3): qty 1

## 2016-08-06 MED ORDER — IRBESARTAN 75 MG PO TABS
75.0000 mg | ORAL_TABLET | Freq: Every day | ORAL | Status: DC
  Administered 2016-08-07: 75 mg via ORAL
  Filled 2016-08-06: qty 1

## 2016-08-06 MED ORDER — PHENYTOIN SODIUM EXTENDED 100 MG PO CAPS: 300.0000 mg | ORAL_CAPSULE | Freq: Three times a day (TID) | ORAL | Status: DC

## 2016-08-06 MED ORDER — HYDROCODONE-ACETAMINOPHEN 5-325 MG PO TABS: 1.0000 | ORAL_TABLET | ORAL | Status: DC | PRN

## 2016-08-06 MED ORDER — ONDANSETRON HCL 4 MG/2ML IJ SOLN: 4.0000 mg | INTRAMUSCULAR | Status: DC | PRN

## 2016-08-06 MED ORDER — DIPHENHYDRAMINE HCL 12.5 MG/5ML PO ELIX: 12.5000 mg | ORAL_SOLUTION | Freq: Four times a day (QID) | ORAL | Status: DC | PRN

## 2016-08-06 MED ORDER — DOCUSATE SODIUM 100 MG PO CAPS
100.0000 mg | ORAL_CAPSULE | Freq: Two times a day (BID) | ORAL | Status: DC
  Administered 2016-08-06 – 2016-08-07 (×2): 100 mg via ORAL
  Filled 2016-08-06 (×2): qty 1

## 2016-08-06 MED ORDER — TRIAMTERENE-HCTZ 37.5-25 MG PO TABS
2.0000 | ORAL_TABLET | Freq: Every day | ORAL | Status: DC
  Administered 2016-08-07: 2 via ORAL
  Filled 2016-08-06 (×2): qty 2

## 2016-08-06 MED ORDER — MORPHINE SULFATE (PF) 2 MG/ML IV SOLN: 2.0000 mg | INTRAVENOUS | Status: DC | PRN

## 2016-08-06 MED ORDER — CIPROFLOXACIN HCL 500 MG PO TABS
ORAL_TABLET | ORAL | Status: AC
  Administered 2016-08-06: 500 mg via ORAL
  Filled 2016-08-06: qty 1

## 2016-08-06 MED ORDER — DIAZEPAM 5 MG PO TABS
ORAL_TABLET | ORAL | Status: AC
  Administered 2016-08-06: 10 mg via ORAL
  Filled 2016-08-06: qty 2

## 2016-08-06 MED ORDER — CIPROFLOXACIN HCL 500 MG PO TABS
500.0000 mg | ORAL_TABLET | ORAL | Status: AC
  Administered 2016-08-06: 500 mg via ORAL

## 2016-08-06 MED ORDER — LIDOCAINE HCL (PF) 1 % IJ SOLN
INTRAMUSCULAR | Status: AC
  Filled 2016-08-06: qty 30

## 2016-08-06 MED ORDER — DIAZEPAM 5 MG PO TABS
10.0000 mg | ORAL_TABLET | ORAL | Status: AC
  Administered 2016-08-06: 10 mg via ORAL

## 2016-08-06 MED ORDER — DIPHENHYDRAMINE HCL 50 MG/ML IJ SOLN: 12.5000 mg | Freq: Four times a day (QID) | INTRAMUSCULAR | Status: DC | PRN

## 2016-08-06 MED ORDER — ACETAMINOPHEN 325 MG PO TABS: 650.0000 mg | ORAL_TABLET | ORAL | Status: DC | PRN

## 2016-08-06 MED ORDER — DIPHENHYDRAMINE HCL 25 MG PO CAPS
ORAL_CAPSULE | ORAL | Status: AC
  Administered 2016-08-06: 25 mg via ORAL
  Filled 2016-08-06: qty 1

## 2016-08-06 NOTE — Progress Notes (Signed)
Per Dr. Erlene Quan okay for pt to have carb modified diet.

## 2016-08-06 NOTE — Progress Notes (Signed)
Spoke to Dr. Erlene Quan in regard to patients code status. Friend at bedside had made comment that patient had recently been changed to DNR. Per MD will keep as full code at this time, as pts are often kept full code around time of surgery.

## 2016-08-06 NOTE — Interval H&P Note (Signed)
History and Physical Interval Note:  08/06/2016 8:09 AM  [name] [name]  has presented today for surgery, with the diagnosis of Kidney stone  The various methods of treatment have been discussed with the patient and family. After consideration of risks, benefits and other options for treatment, the patient has consented to  Procedure(s): EXTRACORPOREAL SHOCK WAVE LITHOTRIPSY (ESWL) (Right) as a surgical intervention .  The patient's history has been reviewed, patient examined, no change in status, stable for surgery.  I have reviewed the patient's chart and labs.  Questions were answered to the patient's satisfaction.    RRR CTAB  Hollice Espy

## 2016-08-06 NOTE — H&P (View-Only) (Signed)
07/29/2016 2:33 PM   [name] [name] Jan 28, 1946 [id number]  Referring provider: Maryella Shivers, MD [place], [place] [address]  Chief Complaint  Patient presents with  . Hydronephrosis    New Patient  . 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 He underwent attempted ureteral stent placement which was unsuccessful. He ultimately went nephrostomy tube placement. During that admission, he had multiple worrisome findings for stage IV metastatic disease. He's been since been diagnosed with metastatic stage IV colorectal cancer.  After careful consideration, he and his healthcare proxy's have elected not to intervene and would prefer palliation.  He presents to the office to discuss management of his right nephrostomy tube.  He seems to be tolerating the tube well but has not had any signs or symptoms of infections since its placement.  It was placed on 06/19/2016.  His right kidney is atrophic.  At the time of obstruction, his creatinine was elevated to 1.7 from 0.9.  He does have a personal history of kidney stones s/p PCNL in 90s.     PMH: Past Medical History:  Diagnosis Date  . Arthritis    osteoporosis  . Asthma   . Chronic kidney disease    stage 3  . Depression   . Diabetes mellitus    Type 2 since Oct 2010  . GERD (gastroesophageal reflux disease)   . Hx of radiation therapy 08/04/11 -09/18/11   post-op, right temple 6600 cGy 33 sessions, field reduction after 6000 cGy in 30 sessions  . Hyperlipemia   . Hypertension   . Kidney stones   . 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 Nephrostomy tube, retained fragment ([place]) 06/23/2016  . Osteoporosis   . Polio      as child-speech slurred  . Seizures ([place])   . 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 CYSTOSCOPY/RETROGRADE/URETEROSCOPY/STONE EXTRACTION WITH BASKET    . MASS EXCISION  04/06/2011   Procedure: EXCISION MASS;  Surgeon: Hermelinda Dellen;  Location: [place];  Service: Plastics;  Laterality: N/A;  excision of squamous cell carcinoma right face with full thickness skin graft from left groin  . TOTAL HIP ARTHROPLASTY      Home Medications:  Allergies as of 07/29/2016      Reactions   Ace Inhibitors Cough      Medication List       Accurate as of 07/29/16 11:59 PM. Always use your most recent med list.          ACCU-CHEK AVIVA PLUS test strip Generic drug:  glucose blood   accu-chek multiclix lancets   busPIRone 10 MG tablet Commonly known as:  BUSPAR Take 10 mg by mouth 3 (three) times daily.   cephALEXin 500 MG capsule Commonly known as:  KEFLEX Take 1 capsule (500 mg total) by mouth 3 (three) times daily.   HYDROcodone-acetaminophen 5-325 MG tablet Commonly known as:  NORCO/VICODIN Take 1 tablet by mouth every 6 (six) hours as needed for moderate pain.   metFORMIN 500 MG tablet Commonly known as:  GLUCOPHAGE Take 500 mg by mouth 2 (two)  times daily with a meal.   phenytoin 100 MG ER capsule Commonly known as:  DILANTIN Take 300 mg by mouth 3 (three) times daily.   ranitidine 150 MG tablet Commonly known as:  ZANTAC Take 150 mg by mouth.   triamterene-hydrochlorothiazide 75-50 MG tablet Commonly known as:  MAXZIDE Take 1 tablet by mouth daily.   valsartan 80 MG tablet Commonly known as:  DIOVAN Take 80 mg by mouth daily.       Allergies:  Allergies  Allergen Reactions  . Ace Inhibitors Cough    Family History: Family History  Problem Relation Age of Onset  . Cancer Mother     melanoma  . Cancer Father     throat  . Cancer Sister      bone  . Cancer Brother     Social History:  reports that he has never smoked. He has never used smokeless tobacco. He reports that he does not drink alcohol or use drugs.  ROS: UROLOGY Frequent Urination?: No Hard to postpone urination?: No Burning/pain with urination?: No Get up at night to urinate?: Yes Leakage of urine?: Yes Urine stream starts and stops?: No Trouble starting stream?: No Do you have to strain to urinate?: No Blood in urine?: No Urinary tract infection?: No Sexually transmitted disease?: No Injury to kidneys or bladder?: No Painful intercourse?: No Weak stream?: No Erection problems?: No Penile pain?: No  Gastrointestinal Nausea?: Yes Vomiting?: No Indigestion/heartburn?: Yes Diarrhea?: Yes Constipation?: No  Constitutional Fever: No Night sweats?: No Weight loss?: Yes Fatigue?: Yes  Skin Skin rash/lesions?: No Itching?: No  Eyes Blurred vision?: No Double vision?: No  Ears/Nose/Throat Sore throat?: No Sinus problems?: No  Hematologic/Lymphatic Swollen glands?: No Easy bruising?: No  Cardiovascular Leg swelling?: Yes Chest pain?: No  Respiratory Cough?: No Shortness of breath?: No  Endocrine Excessive thirst?: No  Musculoskeletal Back pain?: Yes Joint pain?: No  Neurological Headaches?: Yes Dizziness?: No  Psychologic Depression?: Yes Anxiety?: Yes  Physical Exam: BP 113/78   Pulse 86   Ht 5\' 10"  (1.778 m)   Wt 157 lb (71.2 kg)   BMI 22.53 kg/m   Constitutional:  Alert and oriented, No acute distress.  Answers some questions with short answers. In a wheelchair. Accompanied today by her caretaker (POA's sister) and healthcare power of attorney on speaker phone. HEENT: [place] AT, moist mucus membranes.  Trachea midline, no masses. Cardiovascular: No clubbing, cyanosis, or edema. Respiratory: Normal respiratory effort, no increased work of breathing. GI: Abdomen is soft, nontender, nondistended, no abdominal  masses GU: Right nephrostomy tube in place, site clean dry and intact. Draining slightly cloudy urine. Skin: No rashes, bruises or suspicious lesions. Neurologic: Grossly intact, no focal deficits, moving all 4 extremities. Psychiatric: Normal mood and affect.  Laboratory Data: Lab Results  Component Value Date   WBC 6.9 07/23/2016   HGB 12.3 (L) 07/23/2016   HCT 36.6 (L) 07/23/2016   MCV 87.3 07/23/2016   PLT 292 07/23/2016    Lab Results  Component Value Date   CREATININE 1.19 07/23/2016   Urinalysis Results for orders placed or performed in visit on 07/29/16  CULTURE, URINE COMPREHENSIVE  Result Value Ref Range   Urine Culture, Comprehensive Final report (A)    Result 1 Yeast isolated. (A)     Pertinent Imaging: CLINICAL DATA:  RIGHT ureteral calculus, nephrostomy tube, hypertension, diabetes mellitus  EXAM: ABDOMEN - 1 VIEW  COMPARISON:  None  FINDINGS: Pigtail RIGHT thoracostomy tube noted.  Questionable calcification 6  mm diameter versus bowel artifact projecting over the RIGHT kidney.  Large nonspecific calcification projects over RIGHT iliac wing.  Bones demineralized with multilevel degenerative disc disease changes of the lumbar spine.  RIGHT hip prosthesis noted.  Increased stool in colon with scattered gas within large and small bowel loops.  IMPRESSION: No acute abnormalities.  Increased stool in colon.   Electronically Signed   By: Lavonia Dana M.D.   On: 07/29/2016 17:32  KUB was personally reviewed today. Previous CT scan performed at [facility], report reviewed but unable to personally review images.  Assessment & Plan:   71 year old male with an obstructing 14 mm ureteral calculus status post nephrostomy tube placement.  Lengthy discussion today with the patient's legal guardian, Arnette Norris as well as her sister who is present today.  In the setting of metastatic untreated colon cancer, lives overall  prognosis is quite poor. In order to make him more comfortable, they would prefer to get the nephrostomy tube out. I explained that if we were to remove the nephrostomy tube without draining the right kidney, he would likely become septic again and possibly succumb to this illness. Alternatively, options including shockwave lithotripsy for treatment of the ureteral stone versus ureteroscopy with serial removal of the tube were discussed in order to unobstruct the right collecting system.  Alternatives including serial nephrostomy tube changes were also discussed. Additionally, right nephrectomy could be considered if the kidney is truly atrophic (unable to review imaging personally today) however they would not want him to have an invasive procedure.  At the end of our discussion, they're most interested in proceeding with shockwave lithotripsy as if he is deemed a good candidate as this is noninvasive and would potentially allow [place] to remove the nephrostomy tube less risk. They'll bring the CT images by early next week for me to assess his anatomy, stone to skin distance, and Hounsfield units to make sure that he is a good candidate for this. Stone can be seen on KUB today.   1. Right ureteral stone After lengthy discussion, he will likely undergo right ESWL. Risk and benefits were reviewed including risk of bleeding, infection, damage stranding structures, failure of the procedure, amongst others. All questions were answered. Paperwork was filled out.  We discussed that if this procedure fails, we will likely proceed with serial nephrostomy tube exchanges.  - Abdomen 1 view (KUB) - CULTURE, URINE COMPREHENSIVE  2. History of sepsis Urine culture from nephrostomy tube obtained today for perioperative antibiotics.  3. Atrophic kidney History of right atrophic kidney   Hollice Espy, MD  [facility] [address] [place], [place] [address] [phone]   I spent 25 min with this patient of which greater than 50% was spent in counseling and coordination of care with the patient.

## 2016-08-06 NOTE — Plan of Care (Signed)
Problem: Education: Goal: Knowledge of [facility] General Education information/materials will improve Outcome: Not Progressing Pt has a history of mental delay

## 2016-08-06 NOTE — OR Nursing (Signed)
Paged Dr. Erlene Quan to update on patient being so difficult to arouse. She reports she is still waiting for vascular radiology to say if they can do procedure today. Dr. Erlene Quan reports she will admit patient for observation.

## 2016-08-06 NOTE — OR Nursing (Signed)
Patient is much more responsive after dose of flumazenil. He responds sponts. And answers yes to feeling okay.

## 2016-08-06 NOTE — Progress Notes (Signed)
Per Dr. Erlene Quan okay to place pt on D5 1/2NS at 68 as pts blood glucose was 80 and he is lethargic and not eating.

## 2016-08-06 NOTE — OR Nursing (Signed)
Patient transferred to room 203 with report to [facility]. He opens eyes to name called and answers yes to "are you alright." He drifts back off to sleep when not stimulated.

## 2016-08-06 NOTE — Progress Notes (Signed)
Per Seth Bake at [facility], [name] [name] has been NPO since prior to MN and he is not prescribed any NSAIDS or aspirin, nor has he been for at least a week.  No medications have been given since 11pm last evening, she is faxing a copy of his medication record.

## 2016-08-07 DIAGNOSIS — N201 Calculus of ureter: Secondary | ICD-10-CM

## 2016-08-07 DIAGNOSIS — N132 Hydronephrosis with renal and ureteral calculous obstruction: Secondary | ICD-10-CM | POA: Diagnosis not present

## 2016-08-07 DIAGNOSIS — R5383 Other fatigue: Secondary | ICD-10-CM

## 2016-08-07 NOTE — Discharge Instructions (Signed)
See [facility] discharge instructions in chart.  Percutaneous Nephrostomy Home Guide Percutaneous nephrostomy is a procedure to insert a flexible tube into your kidney so that urine can leave your body. This procedure may be done if a medical condition prevents urine from leaving your kidney in the usual way. During the procedure, the nephrostomy tube is inserted in the right or left side of your lower back and is connected to an external drainage bag. After you have a nephrostomy tube placed, urine will collect in the drainage bag outside of your body. You will need to empty and change the drainage bag as needed. You will also need to take steps to care for the area where the nephrostomy tube was inserted (tube insertion site). How do I care for my nephrostomy tube?  Always keep your tubing, the leg bag, or the bedside drainage bag below the level of your kidney so that your urine drains freely.  Avoid activities that would cause bending or pulling of your tubing. Ask your health care provider what activities are safe for you.  When connecting your nephrostomy tube to a drainage bag, make sure that there are no kinks in the tubing and that your urine is draining freely. You may want to gently wrap an elastic bandage over the tubing. This will help keep the tubing in place and prevent it from kinking. Make sure there is no tension on the tubing so it does not become dislodged.  At night, you may want to connect your nephrostomy tube or the leg bag to a larger bedside drainage bag. How do I empty the drainage bag? Empty the leg bag or bedside drainage bag whenever it becomes ? full. Also empty it before you go to sleep. Most drainage bags have a drain at the bottom that allows urine to be emptied. Follow these basic steps: 1. Hold the drainage bag over a toilet or collection container. Use a measuring container if your health care provider told you to measure your urine. 2. Open the drain  of the bag and allow the urine to drain out. 3. After all the urine has drained from the drainage bag, close the drain fully. 4. Flush the urine down the toilet. If a collection container was used, rinse the container. How do I change the dressing around the nephrostomy tube? Change your dressing and clean your tube exit site as told by your health care provider. You may need to change the dressing every day for the first 2 weeks after having a nephrostomy tube inserted. After the first 2 weeks, you may be told to change the dressing two times a week. Supplies needed:   Mild soap and water.  Split gauze pads, 4  4 inches (10 x 10 cm).  Gauze pads, 4  4 inches (10 x 10 cm).  Paper tape. How to change the dressing:  Because of the location of your nephrostomy tube, you may need help from another person to complete dressing changes. Follow these basic steps: 1. Wash hands with soap and water. 2. Gently remove the tape and dressing from around the nephrostomy tube. Be careful not to pull on the tube while removing the dressing. Avoid using scissors because they may damage the tube. 3. Wash the skin around the tube with mild soap and water, rinse well, and pat the skin dry with a clean cloth. 4. Check the skin around the drain for redness, swelling, pus, warmth, or a bad smell. 5. If the  drain was sutured to the skin, check the suture to verify that it is still anchored in the skin. 6. Place two split gauze pads in and around the tube exit site. Do not apply ointments or alcohol to the site. 7. Place a gauze pad on top of the split gauze pad. 8. Coil the tube on top of the gauze. The tubing should rest on the gauze, not on the skin. 9. Place tape around each edge of the gauze pad. 10. Secure the nephrostomy tubing. Make sure that the tube does not kink or become pinched. The tubing should rest on the gauze pad, not on the skin. 11. Dispose of used supplies properly. How do I flush my  nephrostomy tube? Use a saline syringe to rinse out (flush) your nephrostomy tube as told by your health care provider. Flushing is easier if a three-way stopcock is placed between the tube and the drainage bag. One connection of the stopcock connects to your tube, the second connects to the drainage bag, and the third is usually covered with a cap. The lever on the stopcock points to the direction on the stopcock that is closed to flow. Normally, the lever points in the direction of the cap to allow urine to drain from the tube to the drainage bag. Supplies needed:   Rubbing alcohol wipe.  10 mL 0.9% saline syringe. How to flush the tube:  1. Move the lever of the three-way stopcock so it points toward the drainage bag. 2. Clean the cap with a rubbing alcohol wipe. 3. Screw the tip of a 10 mL 0.9% saline syringe onto the cap. 4. Using the syringe plunger, slowly push the 10 mL 0.9% saline in the syringe over 5-10 seconds. If resistance is met or pain occurs while pushing, stop pushing the saline. 5. Remove the syringe from the cap. 6. Return the stopcock lever to the usual position, pointing in the direction of the cap. 7. Dispose of used supplies properly. How do I replace the drainage bag? Replace the drainage bag, three-way stopcock, and any extension tubing as told by your health care provider. Make sure you always have an extra drainage bag and connecting tubing available. 1. Empty urine from your drainage bag. 2. Gather a new drainage bag, three-way stopcock, and any extension tubing. 3. Remove the drainage bag, three-way stopcock, and any extension tubing from the nephrostomy tube. 4. Attach the new leg bag or bedside drainage bag, three-way stopcock, and any extension tubing to the nephrostomy tube. 5. Dispose of the used drainage bag, three-way stopcock, and any extension. Contact a health care provider if:  You have problems with any of the valves or tubing.  You have persistent  pain or soreness in your back.  You have more redness, swelling, or pain around your tube insertion site.  You have more fluid or blood coming from your tube insertion site.  Your tube insertion site feels warm to the touch.  You have pus or a bad smell coming from your tube insertion site.  You have increased urine output or you feel burning when urinating. Get help right away if:  You have pain in your abdomen during the first week.  You have chest pain or have trouble breathing.  You have a new appearance of blood in your urine.  You have a fever or chills.  You have back pain that is not relieved by your medicine.  You have decreased urine output.  Your nephrostomy tube comes out.  This information is not intended to replace advice given to you by your health care provider. Make sure you discuss any questions you have with your health care provider. Document Released: 01/11/2013 Document Revised: 01/03/2016 Document Reviewed: 01/03/2016 Elsevier Interactive Patient Education  2017 [employer].

## 2016-08-07 NOTE — Care Management Obs Status (Signed)
[place] [facility] NOTIFICATION   Patient Details  Name: [name] [name] MRN: [id number] Date of Birth: 04-18-1945   Medicare Observation Status Notification Given:  No (admitted obs less than 24 hours)    Beverly Sessions, RN 08/07/2016, 10:21 AM

## 2016-08-07 NOTE — Discharge Summary (Signed)
Date of admission: 08/06/2016  Date of discharge: 08/07/2016  Admission diagnosis: Right ureteral stone  Discharge diagnosis: same, lethergy  Secondary diagnoses:  Patient Active Problem List   Diagnosis Date Noted  . Right ureteral stone 08/06/2016  . Hypertension   . Hyperlipemia   . GERD (gastroesophageal reflux disease)   . Chronic kidney disease   . Depression   . Arthritis   . Hx of radiation therapy   . Skin cancer   . Mental disorder   . Polio   . Kidney stones   . Seizures ([place])   . Osteoporosis     History and Physical: For full details, please see admission history and physical. Briefly, [name] [name] is a 71 y.o. year old patient with History of a 14 mm obstructing right mid ureteral stone admitted for observation following right ESWL due to lethargy in the PACU.Marland Kitchen   Hospital Course: Patient tolerated the procedure well.  In the recovery area, he was somewhat lethargic. Additionally, the decision was made to exchange the patient's right nephrostomy tube at this time given that this was overdue.  He tolerated the tube exchange well and had an uneventful night.  His hospital course was uncomplicated.  On POD#1 he had met discharge criteria: was eating a regular diet, back to baseline, pain was well controlled, was voiding without a catheter, and was ready to for discharge.    Exam: Blood pressure 103/62, pulse 73, temperature 98.2 F (36.8 C), temperature source Oral, resp. rate 16, height '5\' 10"'$  (1.778 m), weight 157 lb (71.2 kg), SpO2 100 %. No acute distress. Alert, conversive and interactive this morning. Mentally at baseline. No increased work of breathing. No clubbing cyanosis or edema. Abdomen soft, nontender, nondistended. Right nephrostomy tube in place, dressing clean dry and intact draining clear yellow urine. Neuro without focal deficits   Laboratory values:  No results for input(s): WBC, HGB, HCT in the last 72 hours. No results for input(s): NA, K, CL,  CO2, GLUCOSE, BUN, CREATININE, CALCIUM in the last 72 hours. No results for input(s): LABPT, INR in the last 72 hours. No results for input(s): LABURIN in the last 72 hours. Results for orders placed or performed during the hospital encounter of 08/06/16  MRSA PCR Screening     Status: None   Collection Time: 08/06/16  2:10 PM  Result Value Ref Range Status   MRSA by PCR NEGATIVE NEGATIVE Final    Comment:        The GeneXpert MRSA Assay (FDA approved for NASAL specimens only), is one component of a comprehensive MRSA colonization surveillance program. It is not intended to diagnose MRSA infection nor to guide or monitor treatment for MRSA infections.     Disposition: Home  Discharge instruction: See [facility] discharge instructions in chart.    Discharge medications:  Allergies as of 08/07/2016      Reactions   Ace Inhibitors Cough      Medication List    TAKE these medications   ACCU-CHEK AVIVA PLUS test strip Generic drug:  glucose blood   accu-chek multiclix lancets   busPIRone 10 MG tablet Commonly known as:  BUSPAR Take 10 mg by mouth 3 (three) times daily.   fluconazole 100 MG tablet Commonly known as:  DIFLUCAN Take 1 tablet (100 mg total) by mouth daily. X 7 days   HYDROcodone-acetaminophen 5-325 MG tablet Commonly known as:  NORCO/VICODIN Take 1 tablet by mouth every 6 (six) hours as needed for moderate pain.  metFORMIN 500 MG tablet Commonly known as:  GLUCOPHAGE Take 500 mg by mouth 2 (two) times daily with a meal.   phenytoin 100 MG ER capsule Commonly known as:  DILANTIN Take 300 mg by mouth 3 (three) times daily.   ranitidine 150 MG tablet Commonly known as:  ZANTAC Take 150 mg by mouth.   triamterene-hydrochlorothiazide 75-50 MG tablet Commonly known as:  MAXZIDE Take 1 tablet by mouth daily.   valsartan 80 MG tablet Commonly known as:  DIOVAN Take 80 mg by mouth daily.       Followup:  Follow-up Information     Hollice Espy, MD.   Specialty:  Urology Why:  as scheduled Contact information: [address] [place] [address] [phone]

## 2016-08-07 NOTE — Progress Notes (Addendum)
Report called to [facility] at [facility]. EMS called. Awaiting transport. IV removed.

## 2016-08-28 ENCOUNTER — Ambulatory Visit
Admission: RE | Admit: 2016-08-28 | Discharge: 2016-08-28 | Disposition: A | Discharge status: Home / Self Care | Payer: Medicare Other | Source: Ambulatory Visit | Attending: Urology | Admitting: Urology

## 2016-08-28 ENCOUNTER — Encounter: Payer: Self-pay | Admitting: Urology

## 2016-08-28 ENCOUNTER — Ambulatory Visit (INDEPENDENT_AMBULATORY_CARE_PROVIDER_SITE_OTHER): Payer: Medicare Other | Admitting: Urology

## 2016-08-28 VITALS — BP 114/78 | HR 81 | Ht 70.0 in

## 2016-08-28 DIAGNOSIS — Z936 Other artificial openings of urinary tract status: Secondary | ICD-10-CM | POA: Insufficient documentation

## 2016-08-28 DIAGNOSIS — N201 Calculus of ureter: Secondary | ICD-10-CM | POA: Diagnosis not present

## 2016-08-28 MED ORDER — IOPAMIDOL (ISOVUE-200) INJECTION 41%
50.0000 mL | Freq: Once | INTRAVENOUS | Status: AC
  Administered 2016-08-28: 30 mL
  Filled 2016-08-28: qty 50

## 2016-08-28 MED ORDER — SODIUM CHLORIDE 0.9 % IJ SOLN
10.0000 mL | Freq: Once | INTRAMUSCULAR | Status: AC
  Administered 2016-08-28: 10 mL

## 2016-09-01 NOTE — Progress Notes (Signed)
08/28/2016 8:31 AM   [name] [name] Aug 11, 1945 [id number]  Referring provider: No referring provider defined for this encounter.  Chief Complaint  Patient presents with  . 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 He underwent attempted ureteral stent placement which was unsuccessful. He ultimately went nephrostomy tube placement. During that admission, he had multiple worrisome findings for stage IV metastatic disease. He's been since been diagnosed with metastatic stage IV colorectal cancer.  After careful consideration, he and his healthcare proxy's have elected not to intervene and would prefer palliation. He will be initiating hospice care starting on Monday.  After lengthy discussion, in hopes to safely remove his nephrostomy tube for the purpose of palliation, he underwent right ESWL on 08/06/2016. He was admitted following this procedure and its nephrostomy tube was exchanged given concern for risk for infection.  Since his procedure, its unclear if he is passed any stone fragment due to his mental status. He's not had any fevers or any other signs of infection. His right nephrostomy tube is draining well although his caretaker notes that he will let anybody else during the tube other than himself.  His right kidney is atrophic.  At the time of obstruction, his creatinine was elevated to 1.7 from 0.9.  He does have a personal history of kidney stones s/p PCNL in 90s.     PMH: Past Medical History:  Diagnosis Date  . Arthritis    osteoporosis  . Asthma   . Chronic kidney disease    stage 3  . Depression   . Diabetes mellitus    Type 2 since Oct 2010  . GERD (gastroesophageal reflux disease)   . Hx  of radiation therapy 08/04/11 -09/18/11   post-op, right temple 6600 cGy 33 sessions, field reduction after 6000 cGy in 30 sessions  . Hyperlipemia   . Hypertension   . Kidney stones   . 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 Nephrostomy tube, retained fragment ([place]) 06/23/2016  . Osteoporosis   . Polio    as child-speech slurred  . Seizures ([place])   . 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 CYSTOSCOPY/RETROGRADE/URETEROSCOPY/STONE EXTRACTION WITH BASKET    . EXTRACORPOREAL SHOCK WAVE LITHOTRIPSY Right 08/06/2016   Procedure: EXTRACORPOREAL SHOCK WAVE LITHOTRIPSY (ESWL);  Surgeon: Hollice Espy, MD;  Location: [facility];  Service: Urology;  Laterality: Right;  . IR NEPHROSTOMY TUBE CHANGE  08/06/2016  . MASS EXCISION  04/06/2011   Procedure: EXCISION MASS;  Surgeon: Hermelinda Dellen;  Location: [facility];  Service: Plastics;  Laterality: N/A;  excision of squamous cell carcinoma right face with full thickness skin graft from left groin  . TOTAL HIP ARTHROPLASTY      Home Medications:  Allergies as of 08/28/2016      Reactions   Ace Inhibitors Cough      Medication List       Accurate as of 08/28/16 11:59 PM. Always use your most recent med list.          ACCU-CHEK AVIVA PLUS test strip Generic drug:  glucose blood   accu-chek multiclix lancets  Acidophilus 0.5 MG Tabs Take by mouth.   busPIRone 10 MG tablet Commonly known as:  BUSPAR Take 10 mg by mouth 3 (three) times daily.   HYDROcodone-acetaminophen 5-325 MG tablet Commonly known as:  NORCO/VICODIN Take 1 tablet by mouth every 6 (six) hours as needed for moderate pain.   metFORMIN 500 MG tablet Commonly known as:  GLUCOPHAGE Take 500 mg by mouth 2 (two) times daily with a meal.   phenytoin 100 MG ER capsule Commonly known as:  DILANTIN Take 300 mg by mouth 3 (three) times daily.     ranitidine 150 MG tablet Commonly known as:  ZANTAC Take 150 mg by mouth.   triamterene-hydrochlorothiazide 75-50 MG tablet Commonly known as:  MAXZIDE Take 1 tablet by mouth daily.   valsartan 80 MG tablet Commonly known as:  DIOVAN Take 80 mg by mouth daily.       Allergies:  Allergies  Allergen Reactions  . Ace Inhibitors Cough    Family History: Family History  Problem Relation Age of Onset  . Cancer Mother        melanoma  . Cancer Father        throat  . Cancer Sister        bone  . Cancer Brother     Social History:  reports that he has never smoked. He has never used smokeless tobacco. He reports that he does not drink alcohol or use drugs.  ROS: UROLOGY Frequent Urination?: No Hard to postpone urination?: No Burning/pain with urination?: No Get up at night to urinate?: No Leakage of urine?: Yes Urine stream starts and stops?: No Trouble starting stream?: No Do you have to strain to urinate?: No Blood in urine?: No Urinary tract infection?: No Sexually transmitted disease?: No Injury to kidneys or bladder?: No Painful intercourse?: No Weak stream?: No Erection problems?: No Penile pain?: No  Gastrointestinal Nausea?: No Vomiting?: No Indigestion/heartburn?: No Diarrhea?: Yes Constipation?: No  Constitutional Fever: No Night sweats?: No Weight loss?: No Fatigue?: No  Skin Skin rash/lesions?: No Itching?: No  Eyes Blurred vision?: No Double vision?: No  Ears/Nose/Throat Sore throat?: No Sinus problems?: No  Hematologic/Lymphatic Swollen glands?: No Easy bruising?: No  Cardiovascular Leg swelling?: Yes Chest pain?: No  Respiratory Cough?: No Shortness of breath?: No  Endocrine Excessive thirst?: No  Musculoskeletal Back pain?: No Joint pain?: No  Neurological Headaches?: No Dizziness?: No  Psychologic Depression?: No Anxiety?: Yes  Physical Exam: BP 114/78 (BP Location: Right Arm, Patient Position:  Sitting, Cuff Size: Normal)   Pulse 81   Ht 5\' 10"  (1.778 m)   Constitutional:  Alert and oriented, No acute distress.  Answers some questions with short answers. In a wheelchair. Accompanied today by her caretaker (POA's sister) and healthcare power of attorney on speaker phone. HEENT: [place] AT, moist mucus membranes.  Trachea midline, no masses. Cardiovascular: No clubbing, cyanosis, or edema. Respiratory: Normal respiratory effort, no increased work of breathing. GI: Abdomen is soft, nontender, nondistended, no abdominal masses GU: Right nephrostomy tube in place, site clean dry and intact. Draining clear yellow urine urine. Skin: No rashes, bruises or suspicious lesions. Neurologic: Grossly intact, no focal deficits, moving all 4 extremities. Psychiatric: Normal mood and affect.  Laboratory Data: Lab Results  Component Value Date   WBC 6.9 07/23/2016   HGB 12.3 (L) 07/23/2016   HCT 36.6 (L) 07/23/2016   MCV 87.3 07/23/2016   PLT 292 07/23/2016    Lab Results  Component Value Date  CREATININE 1.19 07/23/2016   Urinalysis Results for orders placed or performed during the hospital encounter of 08/06/16  MRSA PCR Screening  Result Value Ref Range   MRSA by PCR NEGATIVE NEGATIVE  Glucose, capillary  Result Value Ref Range   Glucose-Capillary 68 65 - 99 mg/dL  Glucose, capillary  Result Value Ref Range   Glucose-Capillary 156 (H) 65 - 99 mg/dL  Glucose, capillary  Result Value Ref Range   Glucose-Capillary 80 65 - 99 mg/dL    Pertinent Imaging: CLINICAL DATA:  Right renal stone.  EXAM: ABDOMEN - 1 VIEW  COMPARISON:  07/29/2016 .  FINDINGS: Right nephrostomy tube noted stable position. Right nephrolithiasis cannot be excluded. Air-filled loops of small and large bowel noted consistent adynamic ileus. No free air. Diffuse osteopenia degenerative change. Right hip replacement .  IMPRESSION: 1. Right nephrostomy tube noted in stable position.  Right nephrolithiasis cannot be excluded.  2. Distended loops of small and large bowel consistent with adynamic ileus. Follow-up abdominal series suggested demonstrate resolution.   Electronically Signed   By: Marcello Moores  Register   On: 08/28/2016 09:06   KUB was personally reviewed today.  No obvious residual stone fragment.  Assessment & Plan:   71 year old male with an obstructing 14 mm ureteral calculus status post nephrostomy tube placement in the setting of obstruction status post right ESWL which appears to be successful based on KUB.  1. Right ureteral stone We discussed further management of his right nephrostomy tube. Given that the stone appears to have resolved with ESWL, I have recommended proceeding with antegrade nephrostogram to confirm the ureteral patency prior to tube removal. The patient's caretaker is hoping to expedite this given that he will be under the care of hospice starting on Monday and it difficult to transport the patient.  Arrangements have been made after discussing the patient with interventional radiology to have this done today. If his ureter is patent with brisk drainage of the bladder, recommended immediate tube removal. He may follow up as needed.  2. Atrophic kidney History of right atrophic kidney   Hollice Espy, MD  [address] [place], [place] [address] [phone]   I spent 25 min with this patient of which greater than 50% was spent in counseling and coordination of care with the patient.   Case was discussed with interventional radiology today and arrangements were made to have the patient proceed with antegrade nephrostogram today.

## 2017-01-04 DEATH — deceased

## 2018-08-15 IMAGING — CR DG ABDOMEN 1V
1 series · 2 of 2 positions shown · non-contrast
Comparison: 07/29/2016 .

CLINICAL DATA: Right renal stone.

EXAM:
ABDOMEN - 1 VIEW

[Series 1: dg abd 1 view · 0.14mm/px · 2 of 2 slices shown]
[im 1/2]
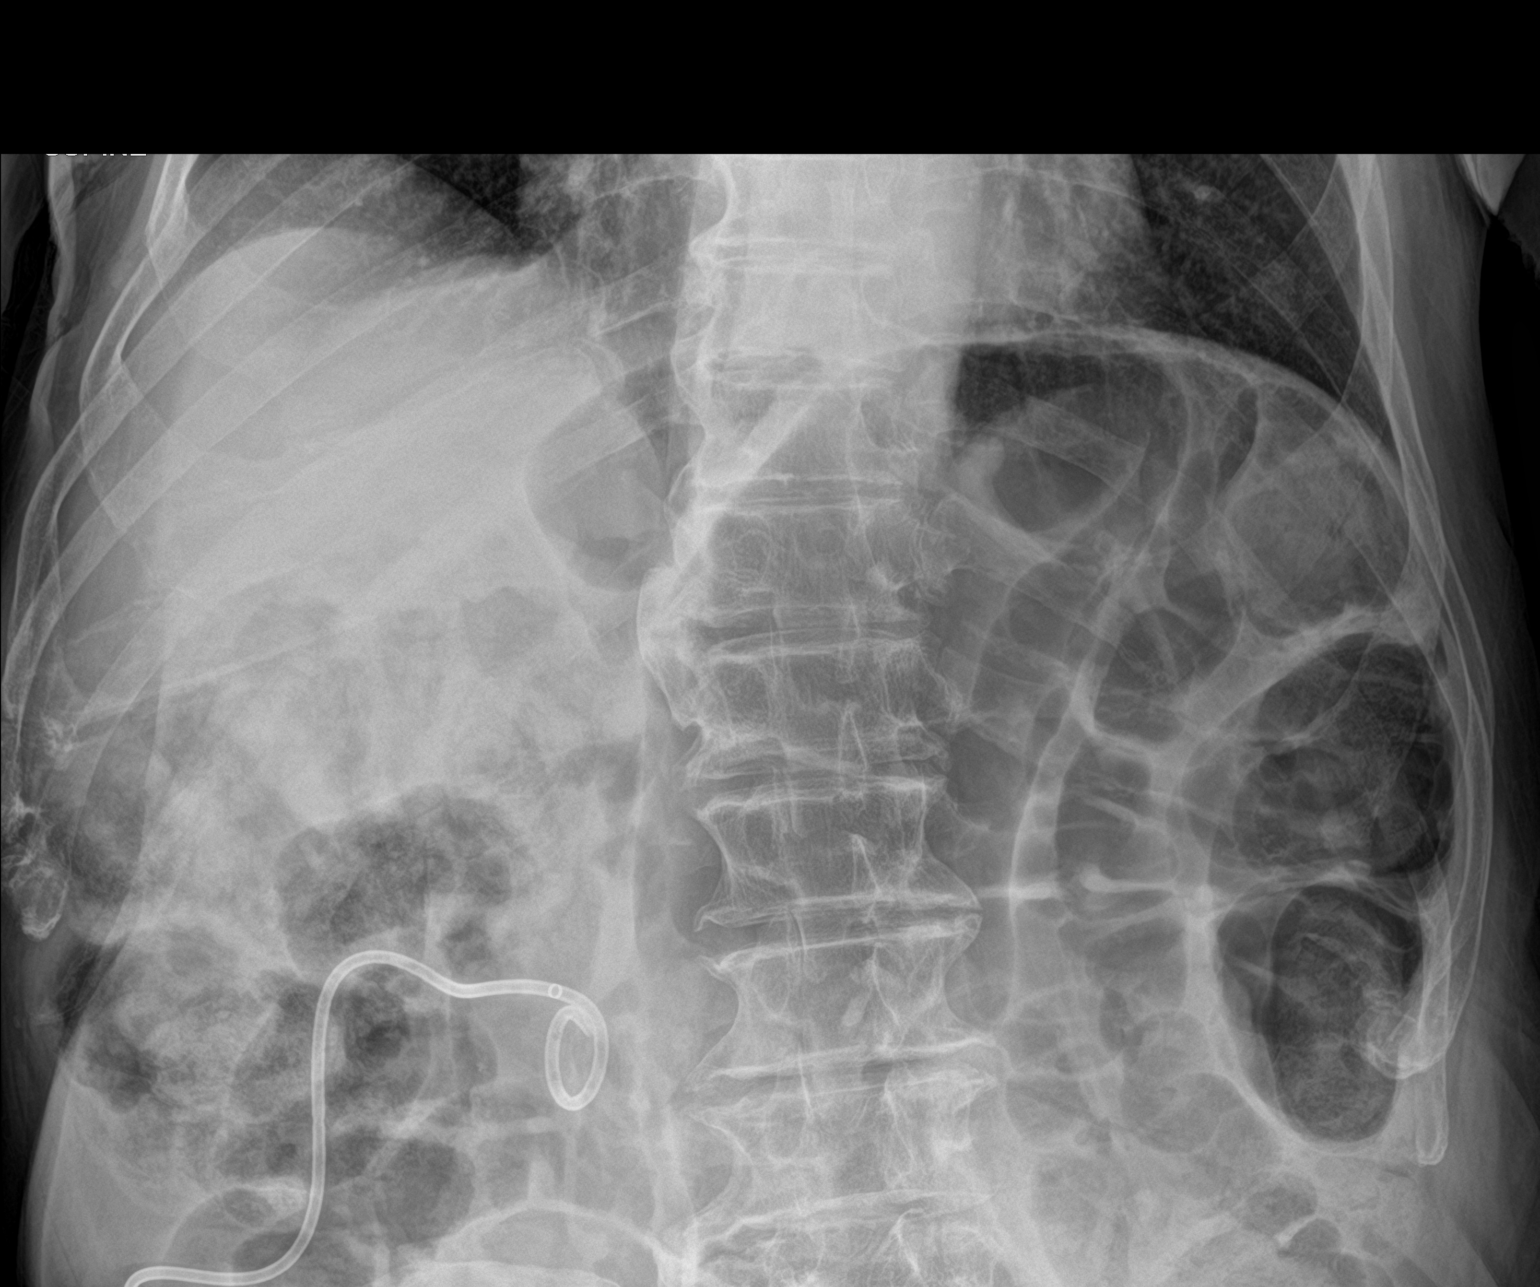
[im 2/2]
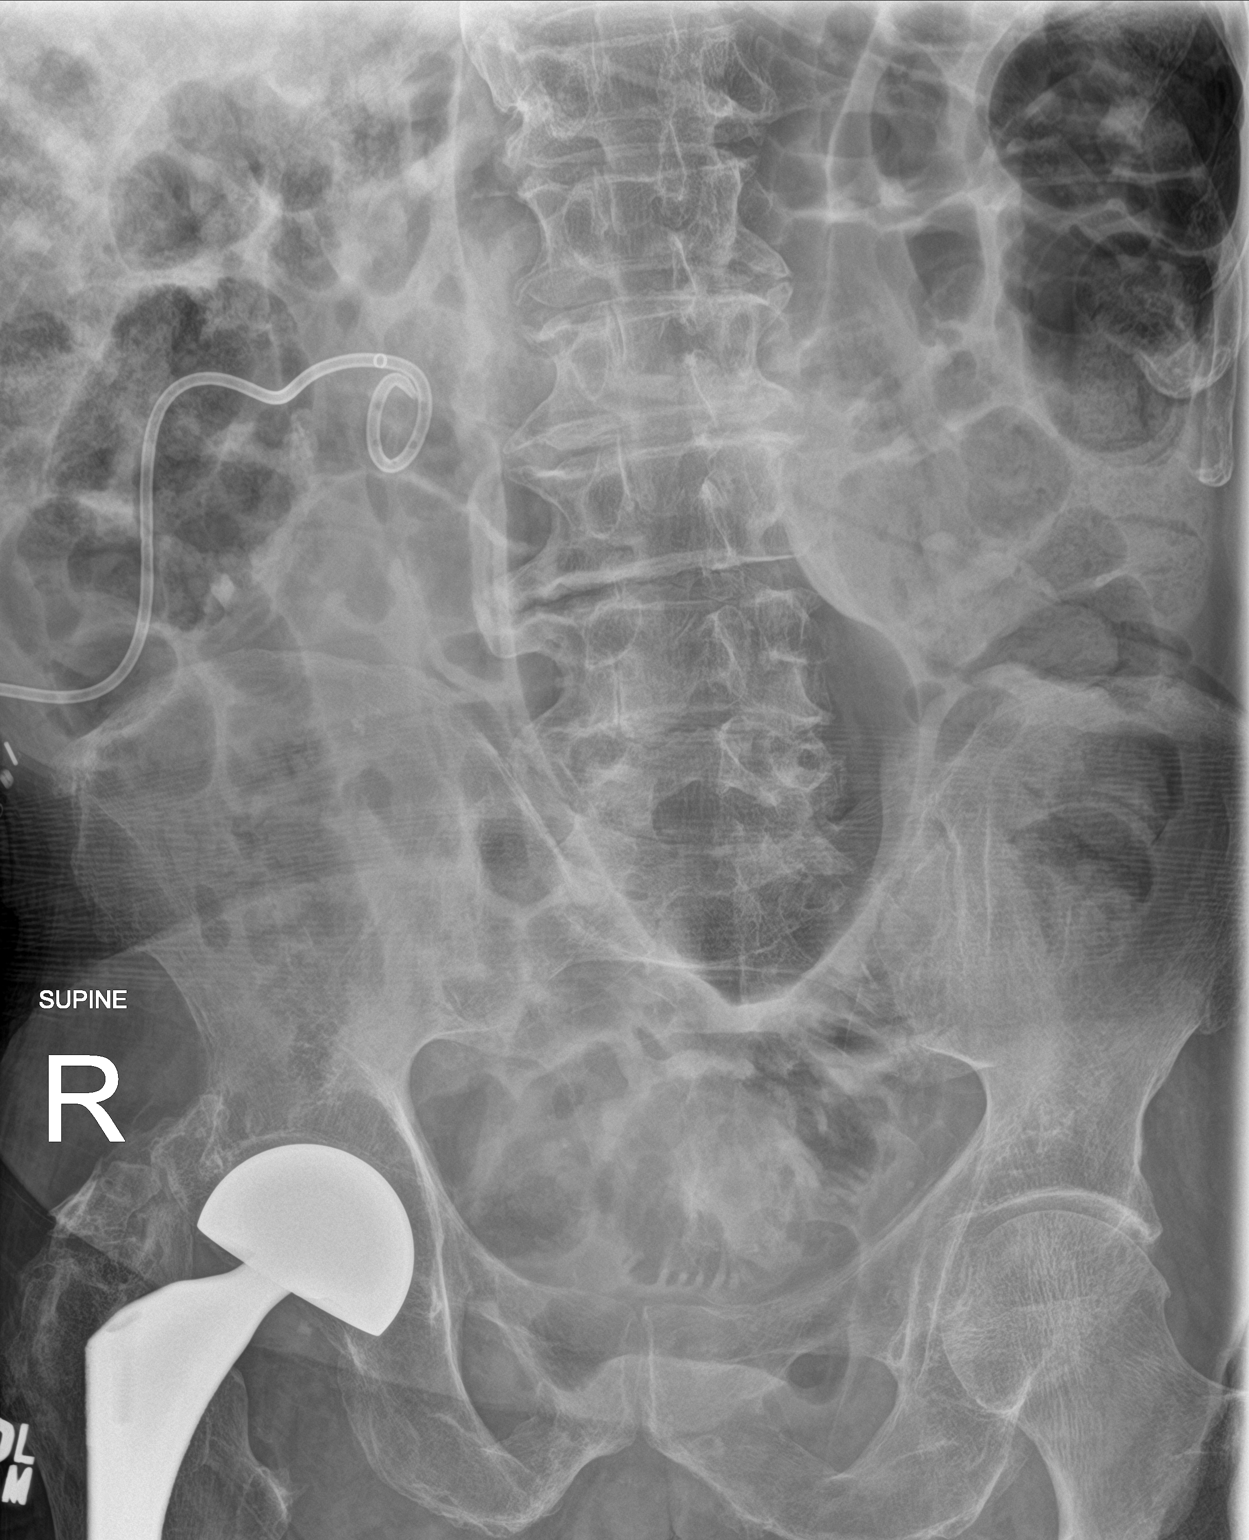

[2 of 2 positions shown; findings below may reference images not displayed]

FINDINGS: Right nephrostomy tube noted stable position. Right nephrolithiasis
cannot be excluded. Air-filled loops of small and large bowel noted
consistent adynamic ileus. No free air. Diffuse osteopenia
degenerative change. Right hip replacement .
IMPRESSION: 1. Right nephrostomy tube noted in stable position. Right
nephrolithiasis cannot be excluded.

2. Distended loops of small and large bowel consistent with adynamic
ileus. Follow-up abdominal series suggested demonstrate resolution.
